# Patient Record
Sex: Female | Born: 1940 | Race: White | Hispanic: No | Smoking: Former smoker
Health system: Southern US, Community
[De-identification: ages and names within clinical notes are randomized; demographics above are authoritative.]

## PROBLEM LIST (undated history)

## (undated) DIAGNOSIS — I10 Essential (primary) hypertension: Secondary | ICD-10-CM

## (undated) DIAGNOSIS — E785 Hyperlipidemia, unspecified: Secondary | ICD-10-CM

## (undated) DIAGNOSIS — I639 Cerebral infarction, unspecified: Secondary | ICD-10-CM

## (undated) DIAGNOSIS — T7840XA Allergy, unspecified, initial encounter: Secondary | ICD-10-CM

## (undated) DIAGNOSIS — H5440 Blindness, one eye, unspecified eye: Secondary | ICD-10-CM

## (undated) DIAGNOSIS — I739 Peripheral vascular disease, unspecified: Secondary | ICD-10-CM

## (undated) DIAGNOSIS — K219 Gastro-esophageal reflux disease without esophagitis: Secondary | ICD-10-CM

## (undated) DIAGNOSIS — M1990 Unspecified osteoarthritis, unspecified site: Secondary | ICD-10-CM

## (undated) HISTORY — DX: Cerebral infarction, unspecified: I63.9

## (undated) HISTORY — DX: Unspecified osteoarthritis, unspecified site: M19.90

## (undated) HISTORY — DX: Allergy, unspecified, initial encounter: T78.40XA

## (undated) HISTORY — DX: Hyperlipidemia, unspecified: E78.5

## (undated) HISTORY — DX: Essential (primary) hypertension: I10

## (undated) HISTORY — PX: OTHER SURGICAL HISTORY: SHX169

## (undated) HISTORY — DX: Peripheral vascular disease, unspecified: I73.9

## (undated) HISTORY — PX: CAROTID ENDARTERECTOMY: SUR193

## (undated) HISTORY — DX: Gastro-esophageal reflux disease without esophagitis: K21.9

## (undated) HISTORY — DX: Blindness, one eye, unspecified eye: H54.40

---

## 1998-02-01 ENCOUNTER — Emergency Department (HOSPITAL_COMMUNITY): Admission: EM | Admit: 1998-02-01 | Discharge: 1998-02-01 | Payer: Self-pay | Admitting: Emergency Medicine

## 1999-11-21 ENCOUNTER — Encounter: Payer: Self-pay | Admitting: Internal Medicine

## 1999-11-21 ENCOUNTER — Ambulatory Visit (HOSPITAL_COMMUNITY): Admission: RE | Admit: 1999-11-21 | Discharge: 1999-11-21 | Payer: Self-pay | Admitting: Internal Medicine

## 1999-11-25 ENCOUNTER — Encounter: Payer: Self-pay | Admitting: Emergency Medicine

## 1999-11-25 ENCOUNTER — Inpatient Hospital Stay (HOSPITAL_COMMUNITY): Admission: EM | Admit: 1999-11-25 | Discharge: 1999-11-26 | Payer: Self-pay | Admitting: Emergency Medicine

## 2000-02-25 ENCOUNTER — Ambulatory Visit (HOSPITAL_COMMUNITY): Admission: RE | Admit: 2000-02-25 | Discharge: 2000-02-25 | Payer: Self-pay | Admitting: Internal Medicine

## 2000-02-25 ENCOUNTER — Encounter: Payer: Self-pay | Admitting: Internal Medicine

## 2001-05-16 ENCOUNTER — Encounter: Admission: RE | Admit: 2001-05-16 | Discharge: 2001-06-09 | Payer: Self-pay | Admitting: Orthopedic Surgery

## 2001-07-22 ENCOUNTER — Ambulatory Visit (HOSPITAL_COMMUNITY): Admission: RE | Admit: 2001-07-22 | Discharge: 2001-07-22 | Payer: Self-pay | Admitting: *Deleted

## 2001-07-22 ENCOUNTER — Encounter: Payer: Self-pay | Admitting: *Deleted

## 2001-08-03 ENCOUNTER — Encounter: Payer: Self-pay | Admitting: Vascular Surgery

## 2001-08-05 ENCOUNTER — Ambulatory Visit (HOSPITAL_COMMUNITY): Admission: RE | Admit: 2001-08-05 | Discharge: 2001-08-06 | Payer: Self-pay | Admitting: Vascular Surgery

## 2001-08-05 ENCOUNTER — Encounter: Payer: Self-pay | Admitting: Vascular Surgery

## 2001-08-16 ENCOUNTER — Encounter (INDEPENDENT_AMBULATORY_CARE_PROVIDER_SITE_OTHER): Payer: Self-pay | Admitting: *Deleted

## 2001-08-16 ENCOUNTER — Inpatient Hospital Stay (HOSPITAL_COMMUNITY): Admission: RE | Admit: 2001-08-16 | Discharge: 2001-08-18 | Payer: Self-pay | Admitting: *Deleted

## 2003-03-26 ENCOUNTER — Emergency Department (HOSPITAL_COMMUNITY): Admission: EM | Admit: 2003-03-26 | Discharge: 2003-03-27 | Payer: Self-pay | Admitting: Emergency Medicine

## 2003-12-18 ENCOUNTER — Ambulatory Visit: Payer: Self-pay | Admitting: Internal Medicine

## 2004-03-19 ENCOUNTER — Ambulatory Visit: Payer: Self-pay | Admitting: Internal Medicine

## 2004-03-26 ENCOUNTER — Ambulatory Visit: Payer: Self-pay | Admitting: Internal Medicine

## 2004-06-23 ENCOUNTER — Ambulatory Visit: Payer: Self-pay | Admitting: Internal Medicine

## 2004-08-25 ENCOUNTER — Ambulatory Visit: Payer: Self-pay | Admitting: Internal Medicine

## 2004-09-23 ENCOUNTER — Ambulatory Visit: Payer: Self-pay | Admitting: Internal Medicine

## 2005-02-10 ENCOUNTER — Ambulatory Visit: Payer: Self-pay | Admitting: Internal Medicine

## 2005-05-11 ENCOUNTER — Ambulatory Visit: Payer: Self-pay | Admitting: Internal Medicine

## 2005-08-19 ENCOUNTER — Ambulatory Visit: Payer: Self-pay | Admitting: Internal Medicine

## 2005-09-10 ENCOUNTER — Ambulatory Visit: Payer: Self-pay | Admitting: Internal Medicine

## 2006-01-08 ENCOUNTER — Ambulatory Visit: Payer: Self-pay | Admitting: Internal Medicine

## 2006-04-16 ENCOUNTER — Ambulatory Visit: Payer: Self-pay | Admitting: Internal Medicine

## 2006-06-09 ENCOUNTER — Encounter: Payer: Self-pay | Admitting: Internal Medicine

## 2006-06-09 DIAGNOSIS — E785 Hyperlipidemia, unspecified: Secondary | ICD-10-CM

## 2006-06-17 ENCOUNTER — Ambulatory Visit: Payer: Self-pay | Admitting: Internal Medicine

## 2006-06-17 LAB — CONVERTED CEMR LAB
ALT: 15 units/L (ref 0–40)
Alkaline Phosphatase: 116 units/L (ref 39–117)
BUN: 17 mg/dL (ref 6–23)
Basophils Relative: 0.3 % (ref 0.0–1.0)
Bilirubin, Direct: 0.2 mg/dL (ref 0.0–0.3)
CO2: 30 meq/L (ref 19–32)
Calcium: 10.2 mg/dL (ref 8.4–10.5)
Creatinine, Ser: 1.5 mg/dL — ABNORMAL HIGH (ref 0.4–1.2)
Eosinophils Relative: 0.1 % (ref 0.0–5.0)
Glucose, Bld: 115 mg/dL — ABNORMAL HIGH (ref 70–99)
HCT: 41.2 % (ref 36.0–46.0)
HDL: 28.2 mg/dL — ABNORMAL LOW (ref >39.0)
Hemoglobin: 14.4 g/dL (ref 12.0–15.0)
LDL Cholesterol: 74 mg/dL (ref 0–99)
Lymphocytes Relative: 20.7 % (ref 12.0–46.0)
Monocytes Absolute: 0.5 10*3/uL (ref 0.2–0.7)
Monocytes Relative: 7.8 % (ref 3.0–11.0)
Neutro Abs: 4.3 10*3/uL (ref 1.4–7.7)
Potassium: 3.8 meq/L (ref 3.5–5.1)
RDW: 12.5 % (ref 11.5–14.6)
Total Bilirubin: 0.9 mg/dL (ref 0.3–1.2)
Total Protein: 7 g/dL (ref 6.0–8.3)
VLDL: 24 mg/dL (ref 0–40)
WBC: 6 10*3/uL (ref 4.5–10.5)

## 2006-08-10 DIAGNOSIS — Z8679 Personal history of other diseases of the circulatory system: Secondary | ICD-10-CM | POA: Insufficient documentation

## 2006-08-30 ENCOUNTER — Telehealth (INDEPENDENT_AMBULATORY_CARE_PROVIDER_SITE_OTHER): Payer: Self-pay | Admitting: *Deleted

## 2006-09-27 ENCOUNTER — Ambulatory Visit: Payer: Self-pay | Admitting: Internal Medicine

## 2006-09-27 DIAGNOSIS — I6990 Unspecified sequelae of unspecified cerebrovascular disease: Secondary | ICD-10-CM | POA: Insufficient documentation

## 2006-09-27 DIAGNOSIS — M1990 Unspecified osteoarthritis, unspecified site: Secondary | ICD-10-CM | POA: Insufficient documentation

## 2006-09-27 DIAGNOSIS — I739 Peripheral vascular disease, unspecified: Secondary | ICD-10-CM

## 2006-09-30 ENCOUNTER — Telehealth: Payer: Self-pay | Admitting: Internal Medicine

## 2006-10-04 ENCOUNTER — Telehealth: Payer: Self-pay | Admitting: Internal Medicine

## 2006-10-05 ENCOUNTER — Ambulatory Visit: Payer: Self-pay | Admitting: Internal Medicine

## 2006-10-05 DIAGNOSIS — I10 Essential (primary) hypertension: Secondary | ICD-10-CM

## 2006-10-07 ENCOUNTER — Ambulatory Visit: Payer: Self-pay | Admitting: Cardiology

## 2006-10-12 ENCOUNTER — Ambulatory Visit: Payer: Self-pay | Admitting: Internal Medicine

## 2006-10-12 DIAGNOSIS — F411 Generalized anxiety disorder: Secondary | ICD-10-CM | POA: Insufficient documentation

## 2006-10-12 LAB — CONVERTED CEMR LAB
CO2: 27 meq/L (ref 19–32)
GFR calc Af Amer: 49 mL/min
GFR calc non Af Amer: 40 mL/min
Glucose, Bld: 120 mg/dL — ABNORMAL HIGH (ref 70–99)
Potassium: 4.4 meq/L (ref 3.5–5.1)

## 2006-11-11 ENCOUNTER — Ambulatory Visit: Payer: Self-pay | Admitting: Internal Medicine

## 2006-12-10 ENCOUNTER — Ambulatory Visit: Payer: Self-pay | Admitting: Internal Medicine

## 2006-12-10 DIAGNOSIS — J309 Allergic rhinitis, unspecified: Secondary | ICD-10-CM | POA: Insufficient documentation

## 2006-12-28 ENCOUNTER — Telehealth (INDEPENDENT_AMBULATORY_CARE_PROVIDER_SITE_OTHER): Payer: Self-pay | Admitting: *Deleted

## 2007-03-02 ENCOUNTER — Telehealth: Payer: Self-pay | Admitting: *Deleted

## 2007-03-09 ENCOUNTER — Ambulatory Visit: Payer: Self-pay | Admitting: Internal Medicine

## 2007-03-09 DIAGNOSIS — K219 Gastro-esophageal reflux disease without esophagitis: Secondary | ICD-10-CM

## 2007-06-07 ENCOUNTER — Ambulatory Visit: Payer: Self-pay | Admitting: Internal Medicine

## 2007-08-30 ENCOUNTER — Telehealth: Payer: Self-pay | Admitting: *Deleted

## 2007-08-30 ENCOUNTER — Telehealth: Payer: Self-pay | Admitting: Internal Medicine

## 2007-09-06 ENCOUNTER — Ambulatory Visit: Payer: Self-pay | Admitting: Internal Medicine

## 2007-10-10 ENCOUNTER — Telehealth: Payer: Self-pay | Admitting: Internal Medicine

## 2007-10-13 ENCOUNTER — Encounter: Payer: Self-pay | Admitting: Internal Medicine

## 2007-11-30 ENCOUNTER — Ambulatory Visit: Payer: Self-pay | Admitting: Internal Medicine

## 2008-03-23 ENCOUNTER — Ambulatory Visit: Payer: Self-pay | Admitting: Internal Medicine

## 2008-03-23 DIAGNOSIS — M5412 Radiculopathy, cervical region: Secondary | ICD-10-CM | POA: Insufficient documentation

## 2008-04-17 ENCOUNTER — Encounter: Payer: Self-pay | Admitting: Internal Medicine

## 2008-05-22 ENCOUNTER — Ambulatory Visit: Payer: Self-pay | Admitting: Internal Medicine

## 2008-05-22 DIAGNOSIS — R634 Abnormal weight loss: Secondary | ICD-10-CM

## 2008-05-22 LAB — CONVERTED CEMR LAB
ALT: 20 units/L (ref 0–35)
AST: 21 units/L (ref 0–37)
Alkaline Phosphatase: 130 units/L — ABNORMAL HIGH (ref 39–117)
Basophils Absolute: 0 10*3/uL (ref 0.0–0.1)
Bilirubin, Direct: 0.1 mg/dL (ref 0.0–0.3)
Eosinophils Absolute: 0 10*3/uL (ref 0.0–0.7)
Eosinophils Relative: 0.1 % (ref 0.0–5.0)
HCT: 38.8 % (ref 36.0–46.0)
Lymphs Abs: 1.2 10*3/uL (ref 0.7–4.0)
MCHC: 35.6 g/dL (ref 30.0–36.0)
MCV: 96 fL (ref 78.0–100.0)
Monocytes Absolute: 0.5 10*3/uL (ref 0.1–1.0)
Platelets: 169 10*3/uL (ref 150.0–400.0)
RDW: 12.7 % (ref 11.5–14.6)
T3, Free: 2.8 pg/mL (ref 2.3–4.2)
Total Bilirubin: 0.8 mg/dL (ref 0.3–1.2)

## 2008-07-06 ENCOUNTER — Encounter: Payer: Self-pay | Admitting: Internal Medicine

## 2008-07-12 ENCOUNTER — Telehealth (INDEPENDENT_AMBULATORY_CARE_PROVIDER_SITE_OTHER): Payer: Self-pay | Admitting: *Deleted

## 2008-07-16 ENCOUNTER — Encounter: Payer: Self-pay | Admitting: Internal Medicine

## 2008-07-16 ENCOUNTER — Telehealth: Payer: Self-pay | Admitting: Internal Medicine

## 2008-07-23 ENCOUNTER — Ambulatory Visit: Payer: Self-pay | Admitting: Internal Medicine

## 2008-07-23 LAB — CONVERTED CEMR LAB: HDL: 32.8 mg/dL — ABNORMAL LOW (ref >39.00)

## 2008-09-17 ENCOUNTER — Telehealth (INDEPENDENT_AMBULATORY_CARE_PROVIDER_SITE_OTHER): Payer: Self-pay | Admitting: *Deleted

## 2008-09-22 ENCOUNTER — Telehealth: Payer: Self-pay | Admitting: Family Medicine

## 2008-09-22 ENCOUNTER — Emergency Department (HOSPITAL_COMMUNITY): Admission: EM | Admit: 2008-09-22 | Discharge: 2008-09-23 | Payer: Self-pay | Admitting: Emergency Medicine

## 2008-09-24 ENCOUNTER — Telehealth: Payer: Self-pay | Admitting: Internal Medicine

## 2008-09-25 ENCOUNTER — Ambulatory Visit: Payer: Self-pay | Admitting: Internal Medicine

## 2008-10-02 ENCOUNTER — Ambulatory Visit: Payer: Self-pay | Admitting: Internal Medicine

## 2008-10-02 DIAGNOSIS — S0100XA Unspecified open wound of scalp, initial encounter: Secondary | ICD-10-CM | POA: Insufficient documentation

## 2008-10-25 ENCOUNTER — Ambulatory Visit: Payer: Self-pay | Admitting: Internal Medicine

## 2009-01-16 ENCOUNTER — Ambulatory Visit: Payer: Self-pay | Admitting: Internal Medicine

## 2009-04-23 ENCOUNTER — Ambulatory Visit: Payer: Self-pay | Admitting: Internal Medicine

## 2009-04-23 DIAGNOSIS — T887XXA Unspecified adverse effect of drug or medicament, initial encounter: Secondary | ICD-10-CM

## 2009-04-23 LAB — CONVERTED CEMR LAB
ALT: 14 units/L (ref 0–35)
BUN: 16 mg/dL (ref 6–23)
Basophils Relative: 0.3 % (ref 0.0–3.0)
CO2: 31 meq/L (ref 19–32)
Chloride: 106 meq/L (ref 96–112)
Creatinine, Ser: 1.3 mg/dL — ABNORMAL HIGH (ref 0.4–1.2)
Direct LDL: 136.9 mg/dL
Eosinophils Absolute: 0 10*3/uL (ref 0.0–0.7)
MCHC: 34.2 g/dL (ref 30.0–36.0)
MCV: 96.9 fL (ref 78.0–100.0)
Monocytes Absolute: 0.3 10*3/uL (ref 0.1–1.0)
Neutrophils Relative %: 76.2 % (ref 43.0–77.0)
RBC: 4.24 M/uL (ref 3.87–5.11)
RDW: 11.5 % (ref 11.5–14.6)
TSH: 2.11 microintl units/mL (ref 0.35–5.50)
Total Protein: 7.3 g/dL (ref 6.0–8.3)

## 2009-08-30 ENCOUNTER — Ambulatory Visit: Payer: Self-pay | Admitting: Internal Medicine

## 2009-08-30 DIAGNOSIS — M1710 Unilateral primary osteoarthritis, unspecified knee: Secondary | ICD-10-CM | POA: Insufficient documentation

## 2009-11-25 ENCOUNTER — Telehealth: Payer: Self-pay | Admitting: Internal Medicine

## 2009-11-26 ENCOUNTER — Ambulatory Visit: Payer: Self-pay | Admitting: Family Medicine

## 2009-11-26 DIAGNOSIS — L600 Ingrowing nail: Secondary | ICD-10-CM

## 2009-12-27 ENCOUNTER — Ambulatory Visit: Payer: Self-pay | Admitting: Internal Medicine

## 2009-12-27 DIAGNOSIS — B3749 Other urogenital candidiasis: Secondary | ICD-10-CM | POA: Insufficient documentation

## 2009-12-27 DIAGNOSIS — L732 Hidradenitis suppurativa: Secondary | ICD-10-CM | POA: Insufficient documentation

## 2009-12-27 LAB — CONVERTED CEMR LAB
Glucose, Urine, Semiquant: NEGATIVE
Specific Gravity, Urine: <1.005
WBC Urine, dipstick: NEGATIVE
pH: 5

## 2010-01-20 ENCOUNTER — Telehealth: Payer: Self-pay | Admitting: Internal Medicine

## 2010-03-11 NOTE — Assessment & Plan Note (Signed)
Summary: 4 month fup//ccm   Vital Signs:  Patient profile:   70 year old female Height:      66 inches Weight:      147 pounds BMI:     23.81 Temp:     98.2 degrees F oral Pulse rate:   72 / minute Resp:     14 per minute BP sitting:   138 / 80  (left arm)  Vitals Entered By: Willy Eddy, LPN (December 27, 2009 1:29 PM) CC: roa- c/o vaginal itching since taking antibioitc- also check brown spots under rt arm, Hypertension Management Is Patient Diabetic? No   Primary Care Provider:  Stacie Glaze MD  CC:  roa- c/o vaginal itching since taking antibioitc- also check brown spots under rt arm and Hypertension Management.  History of Present Illness: follow up for post CVA complicatins of gate, HTN, hyperlipidemia diet and weigth management and  chronic GERD She has an acute complaint of vaginosis and is not sexually active but has recently been on ABX  Hypertension History:      She denies headache, chest pain, palpitations, dyspnea with exertion, orthopnea, PND, peripheral edema, visual symptoms, neurologic problems, syncope, and side effects from treatment.        Positive major cardiovascular risk factors include female age 41 years old or older, hyperlipidemia, and hypertension.  Negative major cardiovascular risk factors include non-tobacco-user status.        Positive history for target organ damage include prior stroke (or TIA) and peripheral vascular disease.     Preventive Screening-Counseling & Management  Alcohol-Tobacco     Smoking Status: quit     Packs/Day: 0.5     Year Started: 1960     Year Quit: 1996     Tobacco Counseling: to remain off tobacco products  Problems Prior to Update: 1)  Hidradenitis  (ICD-705.83) 2)  Candidiasis of Other Urogenital Sites  (ICD-112.2) 3)  Ingrowing Nail  (ICD-703.0) 4)  Loc Osteoarthros Not Spec Prim/sec Lower Leg  (ICD-715.36) 5)  Uns Advrs Eff Uns Rx Medicinal&biological Sbstnc  (ICD-995.20) 6)  Laceration, Scalp   (ICD-873.0) 7)  Weight Loss  (ICD-783.21) 8)  Cervical Strain, With Radiculopathy  (ICD-723.4) 9)  Ear Pain, Bilateral  (ICD-388.70) 10)  Gerd  (ICD-530.81) 11)  Allergic Rhinitis  (ICD-477.9) 12)  Anxiety Disorder, Acute  (ICD-300.00) 13)  Hypertension, Malignant Essential  (ICD-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) 14)  Peripheral Vascular Disease  (ICD-443.9) 15)  Osteoarthritis  (ICD-715.90) 16)  Cerebrovascular Disease Late Effects, 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) 17)  Cerebrovascular Accident, Hx of  (ICD-V12.50) 18)  Hyperlipidemia  (ICD-272.4)  Current Problems (verified): 1)  Ingrowing Nail  (ICD-703.0) 2)  Loc Osteoarthros Not Spec Prim/sec Lower Leg  (ICD-715.36) 3)  Uns Advrs Eff Uns Rx Medicinal&biological Sbstnc  (ICD-995.20) 4)  Laceration, Scalp  (ICD-873.0) 5)  Weight Loss  (ICD-783.21) 6)  Cervical Strain, With Radiculopathy  (ICD-723.4) 7)  Ear Pain, Bilateral  (ICD-388.70) 8)  Gerd  (ICD-530.81) 9)  Allergic Rhinitis  (ICD-477.9) 10)  Anxiety Disorder, Acute  (ICD-300.00) 11)  Hypertension, Malignant Essential  (ICD-401.0) 12)  Peripheral Vascular Disease  (ICD-443.9) 13)  Osteoarthritis  (ICD-715.90) 14)  Cerebrovascular Disease Late Effects, 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 Once Daily 2)  Lipitor 10 Mg  Tabs (Atorvastatin Calcium) .... 1/2 Tab By Mouth Daily 3)  Nexium 40 Mg  Cpdr (Esomeprazole Magnesium) .... Once Daily 4)  Plavix 75 Mg  Tabs (Clopidogrel Bisulfate) .... Once Daily 5)  Catapres-Tts-2 0.2 Mg/24hr Ptwk (Clonidine Hcl) .... Apply Weekly 6)  Alprazolam 0.25 Mg  Tabs (Alprazolam) .... One By Mouth Bid 7)  Carvedilol 25 Mg Tabs (Carvedilol) .... 1/2 By Mouth Two Times A Day 8)  Cyclobenzaprine Hcl 10 Mg Tabs (Cyclobenzaprine Hcl) .... One By Mouth Q Hs 9)  Ambien 10 Mg Tabs (Zolpidem Tartrate) .Marland Kitchen.. 1 At Bedtime As Needed 10)  Azor 10-40 Mg  Tabs (Amlodipine-Olmesartan) .... One By Mouth Daily 11)  Cephalexin 500 Mg Caps (Cephalexin) .... One By Mouth Three Times A Day For 10 Days  Current Medications (verified): 1)  Lexapro 10 Mg  Tabs (Escitalopram Oxalate) .... Once Daily 2)  Crestor 20 Mg Tabs (Rosuvastatin Calcium) .... One By Mouth Weekly 3)  Nexium 40 Mg  Cpdr (Esomeprazole Magnesium) .... Once Daily 4)  Plavix 75 Mg  Tabs (Clopidogrel Bisulfate) .... Once Daily 5)  Catapres-Tts-2 0.2 Mg/24hr Ptwk (Clonidine Hcl) .... Apply Weekly 6)  Alprazolam 0.25 Mg  Tabs (Alprazolam) .... One By Mouth Bid 7)  Carvedilol 25 Mg Tabs (Carvedilol) .... 1/2 By Mouth Two Times A Day 8)  Cyclobenzaprine Hcl 10 Mg Tabs (Cyclobenzaprine Hcl) .... One By Mouth Q Hs 9)  Ambien 10 Mg Tabs (Zolpidem Tartrate) .Marland Kitchen.. 1 At Bedtime As Needed 10)  Azor 10-40 Mg Tabs (Amlodipine-Olmesartan) .... One By Mouth Daily 11)  Mupirocin 2 % Oint (Mupirocin) .... Apply To Redness Under The Arm Two Times A Day 12)  Fluconazole 100 Mg Tabs (Fluconazole) .... One By Mouth Daily For One Week Fro Yeast Infection  Allergies (verified): 1)  Codeine Sulfate (Codeine Sulfate)  Past History:  Family History: Last updated: 2006-10-14 father died from cancer mother  alzheimer's  Social History: Last updated: 10-14-2006 Retired Former Smoker widowed  Risk Factors: Smoking Status: quit (12/27/2009) Packs/Day: 0.5 (12/27/2009)  Past medical, surgical, family and social histories (including risk factors) reviewed, and no changes noted (except as noted below).  Past Medical History: Reviewed history from 03/09/2007 and no changes required. Hyperlipidemia Cerebrovascular accident, hx of-Right hemiparesis Hypertension Osteoarthritis Peripheral vascular disease blind in left eye Allergic rhinitis GERD  Past Surgical History: Reviewed history from 10-14-2006 and no changes required. Carotid endarterectomy2003  right foot surgery right metatarsal  amputation right knee arthroscopy  Family History: Reviewed history from 10-14-06 and no changes required. father died from cancer mother  alzheimer's  Social History: Reviewed history from Oct 14, 2006 and no changes required. Retired Former Smoker widowed  Review of Systems  The patient denies anorexia, fever, weight loss, weight gain, vision loss, decreased hearing, hoarseness, chest pain, syncope, dyspnea on exertion, peripheral edema, prolonged cough, headaches, hemoptysis, abdominal pain, melena, hematochezia, severe indigestion/heartburn, hematuria, incontinence, genital sores, muscle weakness, suspicious skin lesions, transient blindness, difficulty walking, depression, unusual weight change, abnormal bleeding, enlarged lymph nodes, angioedema, and breast masses.    Physical Exam  General:  Well-developed,well-nourished,in no acute distress; alert,appropriate and cooperative throughout examination Head:  normocephalic and no abnormalities observed.   Eyes:  pupils equal and pupils reactive to light.   Ears:  R ear normal and L ear normal.   Nose:  no nasal discharge.   Mouth:  good dentition and pharynx pink and moist.   Neck:  No deformities, masses, or tenderness noted. Lungs:  Normal respiratory effort, chest expands symmetrically. Lungs are clear to auscultation, no crackles or wheezes. Heart:  normal rate and regular rhythm.  Abdomen:  soft and non-tender.   Msk:  no CVA tenderness. Pulses:  R and L carotid,radial,femoral,dorsalis pedis and posterior tibial pulses are full and equal bilaterally Neurologic:  alert & oriented X3 and gait normal.     Impression & Recommendations:  Problem # 1:  HIDRADENITIS (ICD-705.83) Assessment New bactroban to site  Problem # 2:  CANDIDIASIS OF OTHER UROGENITAL SITES (ICD-112.2) Assessment: New after the antibiotics she has a vaginal yeast infections  Problem # 3:  HYPERTENSION, MALIGNANT ESSENTIAL (ICD-401.0)  Her  updated medication list for this problem includes:    Catapres-tts-2 0.2 Mg/24hr Ptwk (Clonidine hcl) .Marland Kitchen... Apply weekly    Carvedilol 25 Mg Tabs (Carvedilol) .Marland Kitchen... 1/2 by mouth two times a day    Azor 10-40 Mg Tabs (Amlodipine-olmesartan) ..... One by mouth daily  BP today: 170/80 Prior BP: 166/80 (11/26/2009)  Prior 10 Yr Risk Heart Disease: 13 % (08/30/2009)  Labs Reviewed: K+: 3.8 (04/23/2009) Creat: : 1.3 (04/23/2009)   Chol: 204 (04/23/2009)   HDL: 38.70 (04/23/2009)   LDL: 74 (06/17/2006)   TG: 121 (06/17/2006)  Problem # 4:  ANXIETY DISORDER, ACUTE (ICD-300.00) very nervous Her updated medication list for this problem includes:    Lexapro 10 Mg Tabs (Escitalopram oxalate) ..... Once daily    Alprazolam 0.25 Mg Tabs (Alprazolam) ..... One by mouth bid  Complete Medication List: 1)  Lexapro 10 Mg Tabs (Escitalopram oxalate) .... Once daily 2)  Crestor 20 Mg Tabs (Rosuvastatin calcium) .... One by mouth weekly 3)  Nexium 40 Mg Cpdr (Esomeprazole magnesium) .... Once daily 4)  Plavix 75 Mg Tabs (Clopidogrel bisulfate) .... Once daily 5)  Catapres-tts-2 0.2 Mg/24hr Ptwk (Clonidine hcl) .... Apply weekly 6)  Alprazolam 0.25 Mg Tabs (Alprazolam) .... One by mouth bid 7)  Carvedilol 25 Mg Tabs (Carvedilol) .... 1/2 by mouth two times a day 8)  Cyclobenzaprine Hcl 10 Mg Tabs (Cyclobenzaprine hcl) .... One by mouth q hs 9)  Ambien 10 Mg Tabs (Zolpidem tartrate) .Marland Kitchen.. 1 at bedtime as needed 10)  Azor 10-40 Mg Tabs (Amlodipine-olmesartan) .... One by mouth daily 11)  Mupirocin 2 % Oint (Mupirocin) .... Apply to redness under the arm two times a day 12)  Fluconazole 100 Mg Tabs (Fluconazole) .... One by mouth daily for one week fro yeast infection  Other Orders: UA Dipstick w/o Micro (manual) (19147)  Hypertension Assessment/Plan:      The patient's hypertensive risk group is category C: Target organ damage and/or diabetes.  Her calculated 10 year risk of coronary heart disease is 9  %.  Today's blood pressure is 138/80.  Her blood pressure goal is < 140/90.  Patient Instructions: 1)  Please schedule a follow-up appointment in 4 months. Prescriptions: FLUCONAZOLE 100 MG TABS (FLUCONAZOLE) one by mouth daily for one week fro yeast infection  #7 x 1   Entered and Authorized by:   Stacie Glaze MD   Signed by:   Stacie Glaze MD on 12/27/2009   Method used:   Electronically to        [facility]  [address] [phone]* (retail)       [address]       [place], [place]  [address]       Ph: [phone] or [phone]       Fax: [phone]   RxID:   [id number] MUPIROCIN 2 % OINT (MUPIROCIN) apply to redness under the arm two times a day  #  15 gm x 11   Entered and Authorized by:   Stacie Glaze MD   Signed by:   Stacie Glaze MD on 12/27/2009   Method used:   Electronically to        [facility]  [address] [phone]* (retail)       [address]       [place], [place]  [address]       Ph: [phone] or [phone]       Fax: [phone]   RxID:   [id number]    Orders Added: 1)  UA Dipstick w/o Micro (manual) [81002] 2)  Est. Patient Level IV [56387]     Laboratory Results   Urine Tests  Date/Time Received: December 27, 2009 2:36 PM  Routine Urinalysis   Color: yellow Appearance: Clear Glucose: negative   (Normal Range: Negative) Bilirubin: negative   (Normal Range: Negative) Ketone: negative   (Normal Range: Negative) Spec. Gravity: <1.005   (Normal Range: 1.003-1.035) Blood: trace-lysed   (Normal Range: Negative) pH: 5.0   (Normal Range: 5.0-8.0) Protein: negative   (Normal Range: Negative) Urobilinogen: 0.2   (Normal Range: 0-1) Nitrite: negative   (Normal Range: Negative) Leukocyte Esterace: negative   (Normal Range: Negative)    Comments: Alfred Levins, CMA  December 27, 2009 2:35 PM

## 2010-03-11 NOTE — Assessment & Plan Note (Signed)
Summary: 3 MTH ROV // RS/PT RESCD//CCM   Vital Signs:  Patient profile:   70 year old female Height:      66 inches Weight:      144 pounds BMI:     23.33 Temp:     98.2 degrees F oral Pulse rate:   76 / minute Resp:     14 per minute BP sitting:   140 / 80  (left arm)  Vitals Entered By: Willy Eddy, LPN (April 23, 2009 1:30 PM) CC: roa, Hypertension Management   CC:  roa and Hypertension Management.  History of Present Illness: blood pressure stable and has been taking the medications as directed has a viral syndrome that has resolved vitrals and weight are stable  Hypertension History:      She complains of peripheral edema, but denies headache, chest pain, palpitations, dyspnea with exertion, orthopnea, PND, visual symptoms, neurologic problems, syncope, and side effects from treatment.  stable.        Positive major cardiovascular risk factors include female age 42 years old or older, hyperlipidemia, and hypertension.  Negative major cardiovascular risk factors include non-tobacco-user status.        Positive history for target organ damage include prior stroke (or TIA) and peripheral vascular disease.     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) 9)  Peripheral Vascular Disease  (ICD-443.9) 10)  Osteoarthritis  (ICD-715.90) 11)  Cerebrovascular Disease Late Effects, 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) 12)  Cerebrovascular Accident, Hx of  (ICD-V12.50) 13)  Hyperlipidemia  (ICD-272.4)  Current Problems (verified): 1)  Laceration, Scalp   (ICD-873.0) 2)  Weight Loss  (ICD-783.21) 3)  Cervical Strain, With Radiculopathy  (ICD-723.4) 4)  Ear Pain, Bilateral  (ICD-388.70) 5)  Gerd  (ICD-530.81) 6)  Allergic Rhinitis  (ICD-477.9) 7)  Anxiety Disorder, Acute  (ICD-300.00) 8)  Hypertension, Malignant Essential  (ICD-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 Once Daily 2)  Lipitor 10 Mg  Tabs (Atorvastatin Calcium) .... 1/2 Tab By Mouth Daily 3)  Nexium 40 Mg  Cpdr (Esomeprazole Magnesium) .... Once Daily 4)  Plavix 75 Mg  Tabs (Clopidogrel Bisulfate) .... Once Daily 5)  Catapres-Tts-2 0.2 Mg/24hr Ptwk (Clonidine Hcl) .... Apply Weekly 6)  Alprazolam 0.25 Mg  Tabs (Alprazolam) .... One By Mouth Bid 7)  Carvedilol 25 Mg Tabs (Carvedilol) .... 1/2 By Mouth Two Times A Day 8)  Cyclobenzaprine Hcl 10 Mg Tabs (Cyclobenzaprine Hcl) .... One By Mouth Q Hs 9)  Ambien 10 Mg Tabs (Zolpidem Tartrate) .Marland Kitchen.. 1 At Bedtime As Needed 10)  Azor 10-40 Mg Tabs (Amlodipine-Olmesartan) .... One By Mouth Daily  Current Medications (verified): 1)  Lexapro 10 Mg  Tabs (Escitalopram Oxalate) .... Once Daily 2)  Lipitor 10 Mg  Tabs (Atorvastatin Calcium) .Marland KitchenMarland KitchenMarland Kitchen  1/2 Tab By Mouth Daily 3)  Nexium 40 Mg  Cpdr (Esomeprazole Magnesium) .... Once Daily 4)  Plavix 75 Mg  Tabs (Clopidogrel Bisulfate) .... Once Daily 5)  Catapres-Tts-2 0.2 Mg/24hr Ptwk (Clonidine Hcl) .... Apply Weekly 6)  Alprazolam 0.25 Mg  Tabs (Alprazolam) .... One By Mouth Bid 7)  Carvedilol 25 Mg Tabs (Carvedilol) .... 1/2 By Mouth Two Times A Day 8)  Cyclobenzaprine Hcl 10 Mg Tabs (Cyclobenzaprine Hcl) .... One By Mouth Q Hs 9)  Ambien 10 Mg Tabs (Zolpidem Tartrate) .Marland Kitchen.. 1 At Bedtime As Needed 10)  Azor 10-40 Mg Tabs  (Amlodipine-Olmesartan) .... One By Mouth Daily  Allergies (verified): No Known Drug Allergies  Past History:  Family History: Last updated: October 13, 2006 father died from cancer mother  alzheimer's  Social History: Last updated: 10-13-06 Retired Former Smoker widowed  Risk Factors: Smoking Status: quit (04/23/2009) Packs/Day: 0.5 (04/23/2009)  Past medical, surgical, family and social histories (including risk factors) reviewed, and no changes noted (except as noted below).  Past Medical History: Reviewed history from 03/09/2007 and no changes required. Hyperlipidemia Cerebrovascular accident, hx of-Right hemiparesis Hypertension Osteoarthritis Peripheral vascular disease blind in left eye Allergic rhinitis GERD  Past Surgical History: Reviewed history from October 13, 2006 and no changes required. Carotid endarterectomy2003  right foot surgery right metatarsal amputation right knee arthroscopy  Family History: Reviewed history from Oct 13, 2006 and no changes required. father died from cancer mother  alzheimer's  Social History: Reviewed history from 10/13/2006 and no changes required. Retired Former Smoker widowed  Review of Systems  The patient denies anorexia, fever, weight loss, weight gain, vision loss, decreased hearing, hoarseness, chest pain, syncope, dyspnea on exertion, peripheral edema, prolonged cough, headaches, hemoptysis, abdominal pain, melena, hematochezia, severe indigestion/heartburn, hematuria, incontinence, genital sores, muscle weakness, suspicious skin lesions, transient blindness, difficulty walking, depression, unusual weight change, abnormal bleeding, enlarged lymph nodes, angioedema, and breast masses.    Physical Exam  General:  well-nourished, well-hydrated, and pale.   Head:  normocephalic and no abnormalities observed.   Eyes:  pupils equal and pupils reactive to light.   Ears:  R ear normal and L ear normal.   Nose:  no nasal  discharge.   Neck:  No deformities, masses, or tenderness noted. Lungs:  normal respiratory effort and no wheezes.   Heart:  normal rate and regular rhythm.   Abdomen:  soft and non-tender.   Msk:  No deformity or scoliosis noted of thoracic or lumbar spine.   Pulses:  R and L carotid,radial,femoral,dorsalis pedis and posterior tibial pulses are full and equal bilaterally Extremities:  No clubbing, cyanosis, edema, or deformity noted with normal full range of motion of all joints.   Neurologic:  No cranial nerve deficits noted. Station and gait are normal. Plantar reflexes are down-going bilaterally. DTRs are symmetrical throughout. Sensory, motor and coordinative functions appear intact. Skin:  Intact without suspicious lesions or rashes Cervical Nodes:  No lymphadenopathy noted   Impression & Recommendations:  Problem # 1:  HYPERTENSION, MALIGNANT ESSENTIAL (ICD-401.0)  monitering Her updated medication list for this problem includes:    Catapres-tts-2 0.2 Mg/24hr Ptwk (Clonidine hcl) .Marland Kitchen... Apply weekly    Carvedilol 25 Mg Tabs (Carvedilol) .Marland Kitchen... 1/2 by mouth two times a day    Azor 10-40 Mg Tabs (Amlodipine-olmesartan) ..... One by mouth daily  BP today: 140/80 Prior BP: 124/80 (01/16/2009)  Prior 10 Yr Risk Heart Disease: 20 % (09/25/2008)  Labs Reviewed: K+: 4.4 (10/12/2006) Creat: : 1.4 (10/12/2006)   Chol: 171 (07/23/2008)  HDL: 32.80 (07/23/2008)   LDL: 74 (06/17/2006)   TG: 121 (06/17/2006)  Orders: TLB-BMP (Basic Metabolic Panel-BMET) (80048-METABOL)  Problem # 2:  HYPERLIPIDEMIA (ICD-272.4)  Her updated medication list for this problem includes:    Lipitor 10 Mg Tabs (Atorvastatin calcium) .Marland Kitchen... 1/2 tab by mouth daily due monitering today Labs Reviewed: SGOT: 21 (05/22/2008)   SGPT: 20 (05/22/2008)  Lipid Goals: Chol Goal: 200 (12/10/2006)   HDL Goal: 40 (12/10/2006)   LDL Goal: 100 (12/10/2006)   TG Goal: 150 (12/10/2006)  Prior 10 Yr Risk Heart Disease: 20  % (09/25/2008)   HDL:32.80 (07/23/2008), 28.2 (06/17/2006)  LDL:74 (06/17/2006)  Chol:171 (07/23/2008), 126 (06/17/2006)  Trig:121 (06/17/2006)  Orders: TLB-Cholesterol, HDL (83718-HDL) TLB-Cholesterol, Direct LDL (83721-DIRLDL) TLB-Cholesterol, Total (82465-CHO) TLB-TSH (Thyroid Stimulating Hormone) (84443-TSH)  Problem # 3:  GERD (ICD-530.81)  Her updated medication list for this problem includes:    Nexium 40 Mg Cpdr (Esomeprazole magnesium) ..... Once daily  Labs Reviewed: Hgb: 13.8 (05/22/2008)   Hct: 38.8 (05/22/2008)  Orders: Venipuncture (14782) TLB-CBC Platelet - w/Differential (85025-CBCD)  Complete Medication List: 1)  Lexapro 10 Mg Tabs (Escitalopram oxalate) .... Once daily 2)  Lipitor 10 Mg Tabs (Atorvastatin calcium) .... 1/2 tab by mouth daily 3)  Nexium 40 Mg Cpdr (Esomeprazole magnesium) .... Once daily 4)  Plavix 75 Mg Tabs (Clopidogrel bisulfate) .... Once daily 5)  Catapres-tts-2 0.2 Mg/24hr Ptwk (Clonidine hcl) .... Apply weekly 6)  Alprazolam 0.25 Mg Tabs (Alprazolam) .... One by mouth bid 7)  Carvedilol 25 Mg Tabs (Carvedilol) .... 1/2 by mouth two times a day 8)  Cyclobenzaprine Hcl 10 Mg Tabs (Cyclobenzaprine hcl) .... One by mouth q hs 9)  Ambien 10 Mg Tabs (Zolpidem tartrate) .Marland Kitchen.. 1 at bedtime as needed 10)  Azor 10-40 Mg Tabs (Amlodipine-olmesartan) .... One by mouth daily  Other Orders: TLB-Hepatic/Liver Function Pnl (80076-HEPATIC)  Hypertension Assessment/Plan:      The patient's hypertensive risk group is category C: Target organ damage and/or diabetes.  Her calculated 10 year risk of coronary heart disease is 20 %.  Today's blood pressure is 140/80.  Her blood pressure goal is < 140/90.  Patient Instructions: 1)  Please schedule a follow-up appointment in 4 months.

## 2010-03-11 NOTE — Assessment & Plan Note (Signed)
Summary: 4 month rov/njr   Vital Signs:  Patient profile:   70 year old female Height:      66 inches Weight:      146 pounds BMI:     23.65 Temp:     98.2 degrees F oral Pulse rate:   72 / minute Resp:     14 per minute BP sitting:   140 / 80  (left arm)  Vitals Entered By: Willy Eddy, LPN (August 30, 2009 1:52 PM) CC: roa-c/o left knee pain, Hypertension Management, Lipid Management Is Patient Diabetic? No   CC:  roa-c/o left knee pain, Hypertension Management, and Lipid Management.  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 increased pain in her right knee with walking, no rest pain no sweeling , no acute injury  Hypertension History:      She denies headache, chest pain, palpitations, dyspnea with exertion, orthopnea, PND, peripheral edema, visual symptoms, neurologic problems, syncope, and side effects from treatment.        Positive major cardiovascular risk factors include female age 4 years old or older, hyperlipidemia, and hypertension.  Negative major cardiovascular risk factors include non-tobacco-user status.        Positive history for target organ damage include prior stroke (or TIA) and peripheral vascular disease.    Lipid Management History:      Positive NCEP/ATP III risk factors include female age 33 years old or older, HDL cholesterol less than 40, hypertension, prior stroke (or TIA), and peripheral vascular disease.  Negative NCEP/ATP III risk factors include non-tobacco-user status.      Preventive Screening-Counseling & Management  Alcohol-Tobacco     Smoking Status: quit     Packs/Day: 0.5     Year Started: 1960     Year Quit: 1996  Problems Prior to Update: 1)  Loc Osteoarthros Not Spec Prim/sec Lower Leg  (ICD-715.36) 2)  Uns Advrs Eff Uns Rx Medicinal&biological Sbstnc   (ICD-995.20) 3)  Laceration, Scalp  (ICD-873.0) 4)  Weight Loss  (ICD-783.21) 5)  Cervical Strain, With Radiculopathy  (ICD-723.4) 6)  Ear Pain, Bilateral  (ICD-388.70) 7)  Gerd  (ICD-530.81) 8)  Allergic Rhinitis  (ICD-477.9) 9)  Anxiety Disorder, Acute  (ICD-300.00) 10)  Hypertension, Malignant Essential  (ICD-401.0) 11)  Peripheral Vascular Disease  (ICD-443.9) 12)  Osteoarthritis  (ICD-715.90) 13)  Cerebrovascular Disease Late Effects, 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) 14)  Cerebrovascular Accident, Hx of  (ICD-V12.50) 15)  Hyperlipidemia  (ICD-272.4)  Current Problems (verified): 1)  Uns Advrs Eff Uns Rx Medicinal&biological Sbstnc  (ICD-995.20) 2)  Laceration, Scalp  (ICD-873.0) 3)  Weight Loss  (ICD-783.21) 4)  Cervical Strain, With Radiculopathy  (ICD-723.4) 5)  Ear Pain, Bilateral  (ICD-388.70) 6)  Gerd  (ICD-530.81) 7)  Allergic Rhinitis  (ICD-477.9) 8)  Anxiety Disorder, Acute  (ICD-300.00) 9)  Hypertension, Malignant Essential  (ICD-401.0) 10)  Peripheral Vascular Disease  (ICD-443.9) 11)  Osteoarthritis  (ICD-715.90) 12)  Cerebrovascular Disease Late Effects, 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 Once Daily 2)  Lipitor 10 Mg  Tabs (Atorvastatin Calcium) .... 1/2 Tab By Mouth Daily 3)  Nexium 40 Mg  Cpdr (Esomeprazole Magnesium) .... Once Daily 4)  Plavix 75 Mg  Tabs (  Clopidogrel Bisulfate) .... Once Daily 5)  Catapres-Tts-2 0.2 Mg/24hr Ptwk (Clonidine Hcl) .... Apply Weekly 6)  Alprazolam 0.25 Mg  Tabs (Alprazolam) .... One By Mouth Bid 7)  Carvedilol 25 Mg Tabs (Carvedilol) .... 1/2 By Mouth Two Times A Day 8)  Cyclobenzaprine Hcl 10 Mg Tabs (Cyclobenzaprine Hcl) .... One By Mouth Q Hs 9)  Ambien 10 Mg Tabs (Zolpidem Tartrate) .Marland Kitchen.. 1 At Bedtime As Needed 10)  Azor 10-40 Mg Tabs (Amlodipine-Olmesartan) .... One By Mouth Daily  Current  Medications (verified): 1)  Lexapro 10 Mg  Tabs (Escitalopram Oxalate) .... Once Daily 2)  Lipitor 10 Mg  Tabs (Atorvastatin Calcium) .... 1/2 Tab By Mouth Daily 3)  Nexium 40 Mg  Cpdr (Esomeprazole Magnesium) .... Once Daily 4)  Plavix 75 Mg  Tabs (Clopidogrel Bisulfate) .... Once Daily 5)  Catapres-Tts-2 0.2 Mg/24hr Ptwk (Clonidine Hcl) .... Apply Weekly 6)  Alprazolam 0.25 Mg  Tabs (Alprazolam) .... One By Mouth Bid 7)  Carvedilol 25 Mg Tabs (Carvedilol) .... 1/2 By Mouth Two Times A Day 8)  Cyclobenzaprine Hcl 10 Mg Tabs (Cyclobenzaprine Hcl) .... One By Mouth Q Hs 9)  Ambien 10 Mg Tabs (Zolpidem Tartrate) .Marland Kitchen.. 1 At Bedtime As Needed 10)  Azor 10-40 Mg Tabs (Amlodipine-Olmesartan) .... One By Mouth Daily  Allergies (verified): No Known Drug Allergies  Past History:  Family History: Last updated: 10/03/2006 father died from cancer mother  alzheimer's  Social History: Last updated: Oct 03, 2006 Retired Former Smoker widowed  Risk Factors: Smoking Status: quit (08/30/2009) Packs/Day: 0.5 (08/30/2009)  Past medical, surgical, family and social histories (including risk factors) reviewed, and no changes noted (except as noted below).  Past Medical History: Reviewed history from 03/09/2007 and no changes required. Hyperlipidemia Cerebrovascular accident, hx of-Right hemiparesis Hypertension Osteoarthritis Peripheral vascular disease blind in left eye Allergic rhinitis GERD  Past Surgical History: Reviewed history from 10-03-2006 and no changes required. Carotid endarterectomy2003  right foot surgery right metatarsal amputation right knee arthroscopy  Family History: Reviewed history from 10-03-06 and no changes required. father died from cancer mother  alzheimer's  Social History: Reviewed history from 10-03-06 and no changes required. Retired Former Smoker widowed  Review of Systems       The patient complains of muscle weakness and difficulty walking.   The patient denies anorexia, fever, weight loss, weight gain, vision loss, decreased hearing, hoarseness, chest pain, syncope, dyspnea on exertion, peripheral edema, prolonged cough, headaches, hemoptysis, abdominal pain, melena, hematochezia, severe indigestion/heartburn, hematuria, incontinence, genital sores, suspicious skin lesions, transient blindness, and depression.    Physical Exam  General:  well-nourished, well-hydrated, and pale.   Head:  normocephalic and no abnormalities observed.   Eyes:  pupils equal and pupils reactive to light.   Ears:  R ear normal and L ear normal.   Nose:  no nasal discharge.   Mouth:  good dentition and pharynx pink and moist.   Neck:  No deformities, masses, or tenderness noted. Lungs:  normal respiratory effort and no wheezes.   Heart:  normal rate and regular rhythm.   Abdomen:  soft and non-tender.   Msk:  decreased ROM and joint tenderness.  right knee Extremities:  trace left pedal edema and trace right pedal edema.     Detailed Neurologic Exam  Speech:    dysarthria.   Cognition:    short-term memory deficit and poor attention.   Cranial Nerves:    normal visual acuity and muscles of mastication are normal.   Coordination:  intention tremor and abnormal finger to nose on right.   Gait:    hemiparetic gait.     Impression & Recommendations:  Problem # 1:  HYPERLIPIDEMIA (ICD-272.4)  Her updated medication list for this problem includes:    Lipitor 10 Mg Tabs (Atorvastatin calcium) .Marland Kitchen... 1/2 tab by mouth daily  Labs Reviewed: SGOT: 19 (04/23/2009)   SGPT: 14 (04/23/2009)  Lipid Goals: Chol Goal: 200 (12/10/2006)   HDL Goal: 40 (12/10/2006)   LDL Goal: 100 (12/10/2006)   TG Goal: 150 (12/10/2006)  Prior 10 Yr Risk Heart Disease: 20 % (09/25/2008)   HDL:38.70 (04/23/2009), 32.80 (07/23/2008)  LDL:74 (06/17/2006)  Chol:204 (04/23/2009), 171 (07/23/2008)  Trig:121 (06/17/2006)  Problem # 2:  LOC OSTEOARTHROS NOT SPEC PRIM/SEC  LOWER LEG (ICD-715.36) severe pain in the right knee requestion injecton fr pain wth know hx of OA Discussed use of medications, application of heat or cold, and exercises.   Orders: Joint Aspirate / Injection, Large (20610) Depo- Medrol 40mg  (J1030) Informed consen obtained and then the right knee  joint was prepped in a sterile manor and 40 mg depo and 1/2 cc 1% lidocaine injected into the synovial space. After care discussed. Pt tolerated procedure well.  Problem # 3:  HYPERTENSION, MALIGNANT ESSENTIAL (ICD-401.0)  Her updated medication list for this problem includes:    Catapres-tts-2 0.2 Mg/24hr Ptwk (Clonidine hcl) .Marland Kitchen... Apply weekly    Carvedilol 25 Mg Tabs (Carvedilol) .Marland Kitchen... 1/2 by mouth two times a day    Azor 10-40 Mg Tabs (Amlodipine-olmesartan) ..... One by mouth daily  BP today: 140/80 Prior BP: 140/80 (04/23/2009)  Prior 10 Yr Risk Heart Disease: 20 % (09/25/2008)  Labs Reviewed: K+: 3.8 (04/23/2009) Creat: : 1.3 (04/23/2009)   Chol: 204 (04/23/2009)   HDL: 38.70 (04/23/2009)   LDL: 74 (06/17/2006)   TG: 121 (06/17/2006)  Problem # 4:  LOC OSTEOARTHROS NOT SPEC PRIM/SEC LOWER LEG (ICD-715.36)  Informed consen obtained and then the joint was prepped in a sterile manor and 40 mg depo and 1/2 cc 1% lidocaine injected into the synovial space. After care discussed. Pt tolerated procedure well.  Discussed use of medications, application of heat or cold, and exercises.   Orders: Joint Aspirate / Injection, Large (20610) Depo- Medrol 40mg  (J1030)  Problem # 5:  CEREBROVASCULAR DISEASE LATE EFFECTS, NOS (ICD-438.9)  Her updated medication list for this problem includes:    Plavix 75 Mg Tabs (Clopidogrel bisulfate) ..... Once daily  Complete Medication List: 1)  Lexapro 10 Mg Tabs (Escitalopram oxalate) .... Once daily 2)  Lipitor 10 Mg Tabs (Atorvastatin calcium) .... 1/2 tab by mouth daily 3)  Nexium 40 Mg Cpdr (Esomeprazole magnesium) .... Once daily 4)  Plavix 75  Mg Tabs (Clopidogrel bisulfate) .... Once daily 5)  Catapres-tts-2 0.2 Mg/24hr Ptwk (Clonidine hcl) .... Apply weekly 6)  Alprazolam 0.25 Mg Tabs (Alprazolam) .... One by mouth bid 7)  Carvedilol 25 Mg Tabs (Carvedilol) .... 1/2 by mouth two times a day 8)  Cyclobenzaprine Hcl 10 Mg Tabs (Cyclobenzaprine hcl) .... One by mouth q hs 9)  Ambien 10 Mg Tabs (Zolpidem tartrate) .Marland Kitchen.. 1 at bedtime as needed 10)  Azor 10-40 Mg Tabs (Amlodipine-olmesartan) .... One by mouth daily  Hypertension Assessment/Plan:      The patient's hypertensive risk group is category C: Target organ damage and/or diabetes.  Her calculated 10 year risk of coronary heart disease is 13 %.  Today's blood pressure is 140/80.  Her blood pressure goal is < 140/90.  Lipid Assessment/Plan:      Based on NCEP/ATP III, the patient's risk factor category is "history of coronary disease, peripheral vascular disease, cerebrovascular disease, or aortic aneurysm".  The patient's lipid goals are as follows: Total cholesterol goal is 200; LDL cholesterol goal is 100; HDL cholesterol goal is 40; Triglyceride goal is 150.  Her LDL cholesterol goal has been met.    Patient Instructions: 1)  Please schedule a follow-up appointment in 4 months. 2)  get a colace suppository at the drug  for the constipation

## 2010-03-11 NOTE — Progress Notes (Signed)
Summary: Pt is req work in appt with Dr.Jenkins only for uti & inf finger  Phone Note Call from Patient Call back at Home Phone [phone]   Caller: caregiver   - Steward Drone Reason for Call: Acute Illness Summary of Call: Pt req work in appt with Dr. Lovell Sheehan only. Pt has ? uti and also pts little finger is infected around nail.  Pt refuses to see another doctor.       Initial call taken by: Lucy Antigua,  November 25, 2009 2:25 PM  Follow-up for Phone Call        ov given  Follow-up by: Willy Eddy, LPN,  November 25, 2009 2:36 PM

## 2010-03-11 NOTE — Assessment & Plan Note (Signed)
Summary: uti and nail infection/bmw   Vital Signs:  Patient profile:   70 year old female Weight:      145 pounds Temp:     98.0 degrees F oral BP sitting:   166 / 80  (left arm) Cuff size:   regular  Vitals Entered By: Sid Falcon LPN (November 26, 2009 10:48 AM)  History of Present Illness: Patient seen for the following issues.  Onset 3 days ago of some burning with urination and some urine frequency. No vaginal discharge. Denies any fever or chills. Unable to give urine specimen here thus far.  No incontinence.  Left fifth digit pain on the border of her nail. Has noticed some redness in recent days. Soaking with Epsom salts and warm water without improvement. No fever. No purulent drainage.  Painful digit for about one month.  No hx of injury.  Allergies (verified): 1)  Codeine Sulfate (Codeine Sulfate)  Past History:  Past Medical History: Last updated: 03/09/2007 Hyperlipidemia Cerebrovascular accident, hx of-Right hemiparesis Hypertension Osteoarthritis Peripheral vascular disease blind in left eye Allergic rhinitis GERD  Past Surgical History: Last updated: 10/14/2006 Carotid endarterectomy2003  right foot surgery right metatarsal amputation right knee arthroscopy  Family History: Last updated: 10/14/2006 father died from cancer mother  alzheimer's  Social History: Last updated: 10/14/2006 Retired Former Smoker widowed  Risk Factors: Smoking Status: quit (08/30/2009) Packs/Day: 0.5 (08/30/2009) PMH-FH-SH reviewed for relevance  Review of Systems  The patient denies anorexia, fever, abdominal pain, hematuria, and incontinence.    Physical Exam  General:  Well-developed,well-nourished,in no acute distress; alert,appropriate and cooperative throughout examination Neck:  No deformities, masses, or tenderness noted. Lungs:  Normal respiratory effort, chest expands symmetrically. Lungs are clear to auscultation, no crackles or wheezes. Heart:   normal rate and regular rhythm.   Msk:  no CVA tenderness. Extremities:  left fifth digit reveals that she has some mild erythema distal to the DIP joint. She has some tenderness on the lateral border of the nail some thickened hyperkeratotic tissue and some whitish discoloration near the base of the nail suggesting some possible purulence   Impression & Recommendations:  Problem # 1:  DYSURIA (ICD-788.1) pt unable to given urine sample in office.  See below.  Finger infection and will cover with Keflex. Her updated medication list for this problem includes:    Cephalexin 500 Mg Caps (Cephalexin) ..... One by mouth three times a day for 10 days  Problem # 2:  INGROWING NAIL (ICD-703.0)  patient has possible ingrown corner of  left fifth digit nail with some hyperkeratotic tissue and question of evolving abscess base of nail. We have recommended anesthesia with digital block and  removing corner of the nail to further explore and patient consents. Digital block left fifth digit 1% Xylocaine without epinephrine. Finger prepped with Betadine. Using straight hemostats and surgical scissors excised the involved portion of nail and cleaned out base. No purulent drainage noted. ?early cellulitis  Her updated medication list for this problem includes:    Cephalexin 500 Mg Caps (Cephalexin) ..... One by mouth three times a day for 10 days  Orders: Nail avulsion, partial or complete (11730)  Complete Medication List: 1)  Lexapro 10 Mg Tabs (Escitalopram oxalate) .... Once daily 2)  Lipitor 10 Mg Tabs (Atorvastatin calcium) .... 1/2 tab by mouth daily 3)  Nexium 40 Mg Cpdr (Esomeprazole magnesium) .... Once daily 4)  Plavix 75 Mg Tabs (Clopidogrel bisulfate) .... Once daily 5)  Catapres-tts-2 0.2 Mg/24hr Ptwk (Clonidine  hcl) .... Apply weekly 6)  Alprazolam 0.25 Mg Tabs (Alprazolam) .... One by mouth bid 7)  Carvedilol 25 Mg Tabs (Carvedilol) .... 1/2 by mouth two times a day 8)  Cyclobenzaprine  Hcl 10 Mg Tabs (Cyclobenzaprine hcl) .... One by mouth q hs 9)  Ambien 10 Mg Tabs (Zolpidem tartrate) .Marland Kitchen.. 1 at bedtime as needed 10)  Azor 10-40 Mg Tabs (Amlodipine-olmesartan) .... One by mouth daily 11)  Cephalexin 500 Mg Caps (Cephalexin) .... One by mouth three times a day for 10 days  Other Orders: Admin 1st Vaccine (16109) Flu Vaccine 52yrs + [phone])  Patient Instructions: 1)  Continue salt water soaks for left fifth finger for the next few days and topical antibiotic daily 2)  Drink plenty of fluids up to 3-4 quarts a day. Cranberry juice is especially recommended in addition to large amounts of water. Avoid caffeine & carbonated drinks, they tend to irritate the bladder, Return in 3-5 days if you're not better: sooner if you're feeling worse.  Prescriptions: CEPHALEXIN 500 MG CAPS (CEPHALEXIN) one by mouth three times a day for 10 days  #30 x 0   Entered and Authorized by:   Evelena Peat MD   Signed by:   Evelena Peat MD on 11/26/2009   Method used:   Electronically to        [facility]  [address] [phone]* (retail)       [address]       [place], [place]  [address]       Ph: [phone] or [phone]       Fax: [phone]   RxID:   [id number]    Orders Added: 1)  Admin 1st Vaccine [90471] 2)  Flu Vaccine 48yrs + [25956] 3)  Est. Patient Level III [38756] 4)  Nail avulsion, partial or complete [11730]  Flu Vaccine Consent Questions     Do you have a history of severe allergic reactions to this vaccine? no    Any prior history of allergic reactions to egg and/or gelatin? no    Do you have a sensitivity to the preservative Thimersol? no    Do you have a past history of Guillan-Barre Syndrome? no    Do you currently have an acute febrile illness? no    Have you ever had a severe reaction to latex? no    Vaccine information given and explained to patient? yes    Are you currently pregnant? no    Lot Number:[id number]   Exp Date:08/09/2010    Site Given  Left Deltoid IM        .[email or web address] [email or web address]

## 2010-03-13 NOTE — Progress Notes (Signed)
Summary:  increased vaginal issues.  Phone Note Call from Patient Call back at Home Phone [phone]   Caller: Daughter---triage vm Reason for Call: Acute Illness Summary of Call: having increased vaginal issues. has used 2 abxs, monistat 7. please advise.     [facility] Initial call taken by: Warnell Forester,  January 20, 2010 11:13 AM  Follow-up for Phone Call        to gyn - [facility] number given to brenda and they will make appointment Follow-up by: Willy Eddy, LPN,  January 20, 2010 12:22 PM

## 2010-04-24 ENCOUNTER — Encounter: Payer: Self-pay | Admitting: Internal Medicine

## 2010-04-25 ENCOUNTER — Encounter: Payer: Self-pay | Admitting: Internal Medicine

## 2010-04-25 ENCOUNTER — Ambulatory Visit: Payer: Medicare Other | Admitting: Internal Medicine

## 2010-04-25 DIAGNOSIS — L03019 Cellulitis of unspecified finger: Secondary | ICD-10-CM

## 2010-04-25 DIAGNOSIS — I6990 Unspecified sequelae of unspecified cerebrovascular disease: Secondary | ICD-10-CM

## 2010-04-25 DIAGNOSIS — L02519 Cutaneous abscess of unspecified hand: Secondary | ICD-10-CM

## 2010-04-25 DIAGNOSIS — E785 Hyperlipidemia, unspecified: Secondary | ICD-10-CM

## 2010-04-25 DIAGNOSIS — K219 Gastro-esophageal reflux disease without esophagitis: Secondary | ICD-10-CM

## 2010-04-25 MED ORDER — AMOXICILLIN-POT CLAVULANATE 875-125 MG PO TABS: 1.0000 | ORAL_TABLET | Freq: Two times a day (BID) | ORAL | Status: AC

## 2010-04-25 NOTE — Assessment & Plan Note (Signed)
Infected 5th digit of the right hand

## 2010-05-12 ENCOUNTER — Telehealth: Payer: Self-pay | Admitting: *Deleted

## 2010-05-12 DIAGNOSIS — L089 Local infection of the skin and subcutaneous tissue, unspecified: Secondary | ICD-10-CM

## 2010-05-12 NOTE — Telephone Encounter (Signed)
Per dr Lovell Sheehan- refer to dr sypher office

## 2010-05-12 NOTE — Telephone Encounter (Signed)
Infected finger is no better since course of Amoxicillin. Still red, and throbbing.

## 2010-05-15 ENCOUNTER — Telehealth: Payer: Self-pay | Admitting: Internal Medicine

## 2010-05-15 NOTE — Telephone Encounter (Signed)
Talked with pam and told her to tell surgeon she needs rehab and the office will tell her who to contact

## 2010-05-15 NOTE — Telephone Encounter (Signed)
Pt is having surgery on finger 05/23/10. 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 Steward Drone wants to know what she needs to do in order to get pt in rehab after surgery on finger? Steward Drone asked Pam, to call about this, in order not to upset Zenia. Pls call Pam at [phone] or [phone] asap.

## 2010-05-18 LAB — DIFFERENTIAL
Basophils Relative: 0 % (ref 0–1)
Lymphs Abs: 1.2 10*3/uL (ref 0.7–4.0)
Monocytes Absolute: 0.4 10*3/uL (ref 0.1–1.0)
Monocytes Relative: 5 % (ref 3–12)
Neutro Abs: 6.3 10*3/uL (ref 1.7–7.7)

## 2010-05-18 LAB — URINE MICROSCOPIC-ADD ON

## 2010-05-18 LAB — POCT I-STAT, CHEM 8
Calcium, Ion: 1.05 mmol/L — ABNORMAL LOW (ref 1.12–1.32)
Chloride: 107 mEq/L (ref 96–112)
Creatinine, Ser: 1.2 mg/dL (ref 0.4–1.2)
Glucose, Bld: 90 mg/dL (ref 70–99)
HCT: 42 % (ref 36.0–46.0)

## 2010-05-18 LAB — URINALYSIS, ROUTINE W REFLEX MICROSCOPIC
Bilirubin Urine: NEGATIVE
Ketones, ur: NEGATIVE mg/dL
Nitrite: NEGATIVE
Urobilinogen, UA: 0.2 mg/dL (ref 0.0–1.0)

## 2010-05-18 LAB — CBC
Hemoglobin: 14.2 g/dL (ref 12.0–15.0)
MCHC: 33.2 g/dL (ref 30.0–36.0)
MCV: 96.9 fL (ref 78.0–100.0)
RBC: 4.43 MIL/uL (ref 3.87–5.11)

## 2010-05-21 ENCOUNTER — Other Ambulatory Visit: Payer: Self-pay | Admitting: Internal Medicine

## 2010-05-22 ENCOUNTER — Encounter (HOSPITAL_BASED_OUTPATIENT_CLINIC_OR_DEPARTMENT_OTHER)
Admission: RE | Admit: 2010-05-22 | Discharge: 2010-05-22 | Disposition: A | Discharge status: Home / Self Care | Payer: PRIVATE HEALTH INSURANCE | Source: Ambulatory Visit | Attending: Orthopedic Surgery | Admitting: Orthopedic Surgery

## 2010-05-22 LAB — BASIC METABOLIC PANEL
BUN: 17 mg/dL (ref 6–23)
CO2: 28 mEq/L (ref 19–32)
Chloride: 106 mEq/L (ref 96–112)
Creatinine, Ser: 1.34 mg/dL — ABNORMAL HIGH (ref 0.4–1.2)
Glucose, Bld: 116 mg/dL — ABNORMAL HIGH (ref 70–99)

## 2010-05-23 ENCOUNTER — Other Ambulatory Visit: Payer: Self-pay | Admitting: Orthopedic Surgery

## 2010-05-23 ENCOUNTER — Ambulatory Visit (HOSPITAL_BASED_OUTPATIENT_CLINIC_OR_DEPARTMENT_OTHER)
Admission: RE | Admit: 2010-05-23 | Discharge: 2010-05-23 | Disposition: A | Discharge status: Home / Self Care | Payer: PRIVATE HEALTH INSURANCE | Source: Ambulatory Visit | Attending: Orthopedic Surgery | Admitting: Orthopedic Surgery

## 2010-05-23 DIAGNOSIS — I10 Essential (primary) hypertension: Secondary | ICD-10-CM | POA: Insufficient documentation

## 2010-05-23 DIAGNOSIS — I69939 Monoplegia of upper limb following unspecified cerebrovascular disease affecting unspecified side: Secondary | ICD-10-CM | POA: Insufficient documentation

## 2010-05-23 DIAGNOSIS — IMO0001 Reserved for inherently not codable concepts without codable children: Secondary | ICD-10-CM | POA: Insufficient documentation

## 2010-05-23 DIAGNOSIS — C4910 Malignant neoplasm of connective and soft tissue of unspecified upper limb, including shoulder: Secondary | ICD-10-CM | POA: Insufficient documentation

## 2010-05-27 NOTE — Op Note (Signed)
NAMEJULIYAH, Lori Fox                ACCOUNT NO.:  [id number]  MEDICAL RECORD NO.:  [id number]          PATIENT TYPE:  LOCATION:                                 FACILITY:  PHYSICIAN:  Lori Fitch. Lori Fox, M.D.      DATE OF BIRTH:  DATE OF PROCEDURE:  05/23/2010 DATE OF DISCHARGE:                              OPERATIVE REPORT   PREOPERATIVE DIAGNOSIS:  Fungating tumor, left small finger, distal phalangeal segment, with expansion of nail mechanism and erosion of distal phalanx.  POSTOPERATIVE DIAGNOSIS:  Soft tissue tumor eroding radial aspect of distal phalanx tuft, distal metaphysis, diaphysis, and proximal metaphysis with erosion of nail mechanism.  FINAL DIAGNOSIS:  Pending pathologic evaluation of incisional biopsy specimen.  OPERATION:  Incisional biopsy of fungating tumor, left small finger, radial pulp, and nail mechanism.  OPERATING SURGEON:  Lori Fitch. Lori Bundrick, MD  ASSISTANT:  Lori Reeks Dasnoit, PA  ANESTHESIA:  Left small finger digital block with 2% lidocaine supplemented by IV sedation, supervising anesthesiologist is Lori Person, MD  INDICATIONS:  Lori Fox is a 70 year old woman referred through the courtesy of Dr. Darryll Fox for evaluation and management of a swollen, painful left small finger pulp.  Lori Fox was thought to initially have a paronychia and has been treated with the course of antibiotics. As the time passed, she has noted expansion of her fingertip and a verrucous mass presenting beneath the nail.  She was subsequently referred for Hand Surgery consult.  Clinical examination revealed expansion of the pulp, a solid mass that was tender and elevation of the nail mechanism.  X-rays of the finger demonstrated erosion of the radial aspect of the tuft and metaphysis and diaphysis of the distal phalanx.  We explained to Lori Fox and her caretakers that this likely was a tumor or an area of chronic abscess.  We recommended incisional biopsy for  definitive diagnosis at this time.  After informed consent, she was brought to the operating room.  Preoperatively, she was advised of the potential risks and benefits of the surgery.  We pointed out that we could not offer prognosis until we had a histopathologic diagnosis.  Should this turn out to be a tumor with malignant features, we would need to perform a finger amputation to obtain margin.  Questions were invited and answered in detail.  DESCRIPTION OF PROCEDURE:  Lori Fox was brought to room #1 of the [facility] and placed in supine position on the operating table.  Following an anesthesia informed consent with Dr. Gypsy Fox, lidocaine, digital block anesthesia supplemented by IV sedation was recommended and accepted.  IV access was obtained with left antecubital fossa due to previous CVA leading to a flail right arm.  The left arm and hand were prepped with Betadine soap solution, sterilely draped.  A 2% lidocaine was infiltrated at the metacarpal head level to obtain a digital block.  After 5 minutes, excellent anesthesia was achieved.  Following routine surgical time-out, the finger was exsanguinated with a gauze wrap and a 1/2-inch Penrose drain was placed in the proximal phalangeal segment as a digital tourniquet.  Incisions were  planned to remove the bulk of the tumor, however, no effort was made to obtain a wide margin.  Due to the fact that this is an unknown and deep lesion, likely some type of neoplasm, an incisional biopsy was planned at this time.  The radial 3 mm of the nail matrix including the dorsal intermediate and ventral nail fold as well as a portion of the pulp skin was resected on block with the tumor.  The tumor was pseudoencapsulated, eroding the distal phalanx, and extended on the volar surface of the distal phalanx towards the flexor tendon insertion.  All visible tumor were shelled out, however, no effort was made to obtain a wide  margin.  The wound was inspected for bleeding points and subsequently dressed open with Xeroflo sterile gauze and a wrist base Coban dressing.  The specimen was placed in formalin and passed off for pathologic evaluation.  We will plan on seeing Ms. Lori Fox back in 3-5 days to discuss the biopsy results.  Based on the erosion of the distal phalanx and expansion of the nail mechanism, this may be a malignant lesion.  We may need to discuss a revision amputation of the finger and may need to proceed with a staging workup.     Lori Fox, M.D.     RVS/MEDQ  D:  05/23/2010  T:  05/24/2010  Job:  [id number]  cc:   Lori Glaze, MD  Electronically Signed by Lori Fox M.D. on 05/27/2010 09:16:19 AM

## 2010-06-10 ENCOUNTER — Ambulatory Visit (HOSPITAL_BASED_OUTPATIENT_CLINIC_OR_DEPARTMENT_OTHER): Admission: RE | Admit: 2010-06-10 | Payer: Medicare Other | Source: Ambulatory Visit | Admitting: Orthopedic Surgery

## 2010-06-17 ENCOUNTER — Telehealth: Payer: Self-pay | Admitting: *Deleted

## 2010-06-17 NOTE — Telephone Encounter (Signed)
Per dr Clemencia Course move site around and use cortisone cream otc at area after tak ing off

## 2010-06-17 NOTE — Telephone Encounter (Signed)
Lori Fox called stating pt is getting red patches and itching from her Catapres patches.

## 2010-06-19 ENCOUNTER — Other Ambulatory Visit: Payer: Self-pay | Admitting: Internal Medicine

## 2010-06-24 NOTE — Assessment & Plan Note (Signed)
[facility]                                 ON-CALL NOTE   Lori Fox, Lori Fox                         MRN:          [id number]  DATE:10/04/2006                            DOB:          Aug 06, 1940    CALLER:  Caregiver, Siri Cole.   TELEPHONE NUMBER:  [phone]   PRIMARY CARE PHYSICIAN:  Dr. Lovell Sheehan   TIME OF CALL:  1:38am   The caregiver calls because [name]'s blood pressure keeps going up.  She used to be on Ziac and it was changed to Bistolic about a week ago.  Her blood pressure has been running high continuously since that time,  so on Friday she was told to give her 2 Bistolic 5 mg daily instead of  1. Blood pressure is still going up and she calls me now because it was  last 240/104. She is not having any symptoms. There is no headache. No  vision changes. No chest pain. She has had a stroke in the past and the  caregiver felt that she may need some intervention.   PLAN:  I told her that this medicine does not seem to be doing the job  now and it is higher now than it was on the Ziac, so clearly she needs a  change. The caregiver says that the status of her blood pressure cuff  has been checked to make sure that it is working properly. I have seen  an improper cuff frequently in this situation and that certainly is also  a possibility. I told her that if she has any symptoms at all that she  could call 911 and have her evaluated in the emergency room, but at this  point, it might be reasonable to wait until the office opens in a few  hours and have her seen today to change her medicines or add something  else. Apparently, she is not on anything else for her blood pressure.     Karie Schwalbe, MD  Electronically Signed    RIL/MedQ  DD: 10/04/2006  DT: 10/04/2006  Job #: [id number]   cc:   Stacie Glaze, MD

## 2010-06-27 NOTE — H&P (Signed)
[place]. [facility]  Patient:    Lori Fox, Lori Fox Visit Number: [id number] MRN: [id number]          Service Type: DSU Location: [facility]UA 2926 01 Attending Physician:  Bennye Alm Dictated by:   Lissa Merlin, P.A. Admit Date:  08/05/2001 Discharge Date: 08/06/2001   CC:         Stacie Glaze, M.D. [facility]   History and Physical  DATE OF BIRTH:  11-30-1940  PRIMARY CARE PHYSICIAN:  Dr. Stacie Glaze.  CHIEF COMPLAINT:  Severe right internal carotid artery stenosis.  HISTORY OF PRESENT ILLNESS:  This is a 70 year old white married female who has a previous history of left internal carotid artery occlusion and left hemispheric CVA approximately seven years ago.  She had resulting right hemiparesis.  She had been in her usual state of health when a right carotid bruit was picked up on physical exam recently.  She was referred to CVTS, where a Doppler exam confirmed severe right internal carotid artery stenosis. She saw Dr. Denman George, July 18, 2001.  He scheduled a MRI, which confirmed the severe disease.  Dr. Madilyn Fireman recommended right carotid endarterectomy.  This was discussed with the patient and it has been agreed to proceed on August 16, 2001.  The patient has been in her usual state of health with no new complaints and presented today for her routine presurgical tests.  PAST MEDICAL HISTORY:  1. 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  2. 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  3. Clubfoot.  4. Hypertension.  5. Hyperlipidemia.  6. Degenerative joint disease.  (She denies a history of coronary artery disease, MI, diabetes, arrhythmia, thyroid problems, asthma, COPD.)  PAST SURGICAL HISTORY:  1. Right foot transmetatarsal amputation.  2. Right knee arthroscopy.  SOCIAL HISTORY:  She is married with a supportive husband.  She used to  work at [employer] before her stroke but is now on disability.  She quit smoking 7 years ago after 76 pack years, starting at 70 years old.  She denies a history of alcohol use.  She is normally up and around at home with a cane.  FAMILY HISTORY:  She has one daughter who is healthy at 32 years old.  Her mother is in a nursing home with Alzheimers; she is 70 years old.  Her father is 54 years old; he has had an MI but is very active.  She has a brother who is deceased from brain cancer and another brother that is living.  REVIEW OF SYSTEMS:  She denies fevers, chills or weight loss.  She denies chest pain or palpitations.  She denies cough, shortness of breath.  She denies visual changes.  She denies neurological symptoms.  She denies GI or GU symptoms.  MEDICATIONS:  1. Enteric-coated aspirin 81 mg one p.o. q.d.  2. Verapamil 240 mg one p.o. q.d.  3. Lipitor 10 mg one p.o. q.d.  4. Altace 5 mg one p.o. q.d.  5. Zyrtec 10 mg one p.o. q.d.  6. Paxil 20 mg one p.o. q.d.  7. Coumadin -- alternating doses of 3 mg, 3 mg and 4 mg on subsequent days.  8. Zyprexa 2.5 mg b.i.d.  9. Darvocet p.r.n. 10. Hydroxyzine 25 mg b.i.d. p.r.n.  ALLERGIES:  CODEINE and MORPHINE cause hives.  LABORATORY AND ACCESSORY DATA:  Doppler exam on July 12, 2001 showed 80-99% right ICA stenosis and an occluded left ICA.  ASSESSMENT AND PLAN:  This is a 70 year old female with known left internal carotid artery occlusion and previous left brain cerebrovascular accident and hemiparesis.  She now presents with finding of severe right internal carotid artery stenosis, asymptomatic.  Because of her severe stenosis and her previous left-side occlusion, Dr. Madilyn Fireman recommends proceeding with right carotid endarterectomy.  This has been discussed, including risks, benefits, details and alternatives, and it is agreed to proceed.  She has undergone her routine presurgical tests, she has been feeling well with no  symptoms and is stable for surgery. Dictated by:   Lissa Merlin, P.A. Attending Physician:  Bennye Alm DD:  08/11/01 TD:  08/15/01 Job: [id number]

## 2010-06-27 NOTE — Op Note (Signed)
[place]. [facility]  Patient:    Lori Fox, Lori Fox Visit Number: [id number] MRN: [id number]          Service Type: SUR Location: 2000 2022 01 Attending Physician:  Melvenia Needles Dictated by:   Denman George, M.D. Proc. Date: 08/16/01 Admit Date:  08/16/2001 Discharge Date: 08/18/2001   CC:         Stacie Glaze, M.D. [facility]   Operative Report  PREOPERATIVE DIAGNOSIS:  Severe right internal carotid artery stenosis.  POSTOPERATIVE DIAGNOSIS:  Severe right internal carotid artery stenosis.  PROCEDURE:  Right carotid endarterectomy, Dacron patch angioplasty.  SURGEON:  Denman George, M.D.  ASSISTANTS:  Di Kindle. Edilia Bo, M.D., and Tollie Pizza. Collins, P.A.-C.  ANESTHESIA:  General endotracheal.  ANESTHESIOLOGIST:  Cliffton Asters. Ivin Booty, M.D.  CLINICAL NOTE:  This is a 70 year old female with a history of severe disabling left hemispheric CVA with right hemiparesis.  She has returned to a moderate functional level and is living at home.  She recently underwent Doppler evaluation, which revealed severe right internal carotid artery stenosis with left internal carotid artery occlusion.  Arteriography verified these findings.  She is brought to the operating room at this time for right carotid endarterectomy for reduction of stroke risk.  She consented for surgery. Risks and benefits of this procedure explained to the patient in detail.  The major morbidity and mortality associated with this operation of approximately 1-2%, to include but not limited to MI, CVA, or death.  DESCRIPTION OF PROCEDURE:  Patient brought to the operating room in stable condition.  Placed in the supine position.  General endotracheal anesthesia induced.  Foley catheter and arterial line in place.  Right neck prepped and draped in a sterile fashion.  Curvilinear skin incision made along the anterior border of the right sternomastoid muscle.  Subcutaneous tissue and  platysma divided with electrocautery.  Deep dissection carried along the sternomastoid muscle to the carotid sheath.  The common carotid artery mobilized proximally and encircled with a vessel loop.  Vagus nerve reflected posteriorly and preserved.  Distal dissection carried up to the carotid bifurcation.  The external carotid artery encircled with a vessel loop.  The internal carotid artery then followed distally up to the posterior belly of the digastric muscle.  The hypoglossal nerve identified and preserved.  The distal internal carotid artery encircled with a vessel loop.  The carotid bifurcation revealed a large amount of calcific plaque with a large plaque extending into the origin of the right internal carotid artery.  The patient was administered 7000 units of heparin intravenously.  Adequate circulation time permitted.  The carotid vessels controlled with clamps.  A longitudinal arteriotomy made in the distal common carotid artery.  The arteriotomy extended across the carotid bulb and up into the internal carotid artery, where there was a severe stenosis associated with a large calcified plaque.  Stenosis was greater than 80%.  The arteriotomy was extended in the internal carotid artery beyond the plaque.  A shunt was then inserted.  An endarterectomy elevator was then used to remove the plaque.  The endarterectomy carried down into the common carotid artery, where the plaque was divided transversely with Potts scissors.  Plaque then raised up into the bulb, where the external carotid was endarterectomized using an eversion technique.  Plaque then removed from the internal carotid artery and feathered out distally.  Fragments of plaque were removed with plaque forceps.  The site irrigated with heparin and saline solution.  A Finesse patch was then placed over the endarterectomy site using running 6-0 Prolene suture.  At completion of the patch angioplasty, the shunt  was removed.  All vessels were flushed and initial antegrade flow was directed up the external carotid artery.  The internal carotid artery then released. There was an excellent pulse and Doppler signal in the distal internal carotid artery.  The patient administered 50 mg of protamine intravenously, adequate hemostasis obtained.  Sponge and instrument counts were correct.  The sternomastoid fascia closed with running 2-0 Vicryl suture.  The platysma closed with running 3-0 Vicryl suture.  The skin closed with 4-0 Monocryl. Sterile dressing applied.  The patient tolerated the procedure well.  Anesthesia was reversed and returned to preoperative baseline neurologic level.  Transferred to the recovery room in stable condition, no apparent complications. Dictated by:   Denman George, M.D. Attending Physician:  Melvenia Needles DD:  08/16/01 TD:  08/19/01 Job: [id number]

## 2010-06-27 NOTE — Discharge Summary (Signed)
NAME:  Lori Fox, Lori Fox                          ACCOUNT NO.:  [id number]   MEDICAL RECORD NO.:  [id number]                   PATIENT TYPE:  INP   LOCATION:  2022                                 FACILITY:  [facility]   PHYSICIAN:  Gina L. Thomasena Edis, P.A.               DATE OF BIRTH:  1940-11-17   DATE OF ADMISSION:  08/16/2001  DATE OF DISCHARGE:  08/18/2001                                 DISCHARGE SUMMARY   PRIMARY ADMITTING DIAGNOSIS:  Severe right internal carotid artery stenosis.   ADDITIONAL DIAGNOSES:  1. History of left hemispheric cerebrovascular accident approximately seven     years ago.  2. Occluded left internal carotid artery.  3. Hypertension.  4. Hyperlipidemia.  5. Degenerative joint disease.  6. Blindness in left eye from birth.   PROCEDURE PERFORMED:  Right carotid endarterectomy with Dacron patch  angioplasty.   HISTORY:  The patient is a 70 year old white female who has a history of  left hemispheric CVA approximately seven years ago.  She had had a known  left internal carotid artery occlusion since that time.  On recent physical  exam, she was noted to have a right carotid bruit.  She has been  asymptomatic since her previous stroke.  She was referred to Dr. Liliane Bade  and was seen in the CVTS office where a Doppler study was performed showing  severe right internal carotid artery stenosis.  She had a follow up MRI  which also confirmed severe disease.  It was recommended at this time that  she proceed with a right carotid endarterectomy.   HOSPITAL COURSE:  She was admitted on August 16, 2001, and taken to the  operating room where she underwent a right carotid endarterectomy with  Dacron patch angioplasty.  She tolerated the procedure well and was  transferred from the PACU to the floor in stable condition.  Postoperatively, she has done well.  Her neurological status has remained  stable throughout her admission.  Her neck incision is healing well.  She  has  remained afebrile and all vital signs have been stable.  She has been  mobilized with the assistance of physical therapy and is doing quite well.  She has been tolerating a regular diet and is having normal bowel and  bladder function.  On August 18, 2001, she was deemed ready for discharge  home.   DISCHARGE MEDICATIONS:  1. Plavix 75 mg q.d.  2. Verapamil 240 mg q.d.  3. Lipitor 2 mg q.d.  4. Altace 5 mg q.d.  5. Zyrtec 10 mg q.d.  6. Paxil 10 mg q.a.m.  7. Zyprexa 2.5 mg b.i.d.  8. Hydroxyzine 25 mg b.i.d. p.r.n.  9. Tylox one to two q.4h. P.r.n. for pain.   DISCHARGE INSTRUCTIONS:  She is to refrain from driving, heavy lifting, or  strenuous activity.  She may continue daily walking and use of incentive  spirometry.  She is asked to continue her same preoperative diet.  She may  shower daily, clean her incisions with soap and water.   DISCHARGE FOLLOWUP:  She will be seen in the CVTS office in follow up by Dr.  Madilyn Fireman on Monday, August 29, 2001, at 10 a.m.  She is asked to call our office  if she has any problems or questions in the interim.                                                Gina L. Seward Meth.    GLC/MEDQ  D:  09/08/2001  T:  09/14/2001  Job:  [id number]   cc:   CVTS office   Stacie Glaze, M.D. [facility]

## 2010-06-27 NOTE — Discharge Summary (Signed)
[place]. [facility]  Patient:    Lori Fox, Lori Fox                         MRN: [id number] Adm. Date:  [id number] Disc. Date: 11/26/99 Attending:  Carrie Mew Dictator:   Cornell Barman, P.A. CC:         Stacie Glaze, M.D. [facility]                           Discharge Summary  DISCHARGE DIAGNOSES: 1. Atypical chest pain, myocardial infarction ruled out. 2. 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 3. Celiac adenopathy.  BRIEF ADMISSION HISTORY:  [name] is a 70 year old white female who presented with substernal chest pain, non-exertional, lasting 30 minutes.  It was severe and associated nausea and vomiting and relieved in the ER.  PAST MEDICAL HISTORY:  1. Panic attacks.  2. History of CVA in 1997 with a right hemiplegia.  3. Hypertension.  4. Clubbed foot on the right.  5. Chronic anticoagulation.  LABORATORIES ON ADMISSION:  The CBC and BMET were normal.  The CK was 62, the MB was 1, and the troponin I was less than 0.01.  The pro time was 21.4 and the INR was 2.6.  HOSPITAL COURSE: #1 - ATYPICAL CHEST PAIN:  Cardiac risk factors including hypertension, remote tobacco, and obesity.  The patient was admitted to rule out myocardial infarction.  Cardiac enzymes were negative x 3.  The EKG was without evidence of ischemia.  Therefore, we will pursue an outpatient Persantine Cardiolite in the next one to two days.  The patient was also started on some Protonix to rule out gastroesophageal reflux disease and some Vioxx for possible musculoskeletal etiology of her chest pain.  #2 - CELIAC ADENOPATHY:  The patient did have a CT of the chest that did not show any evidence of aortic dissection.  The patient was noted to have bilateral lower lobe ground glass appearance consistent with alveolitis.  The etiology of this is not clear.  The patient was also noted to have a 1 cm celiac node, question neoplastic,  recommending percutaneous biopsy.  We will discuss this with radiology and arrange for this to be done as an outpatient.  MEDICATIONS AT DISCHARGE: 1. Coumadin 3 mg q.d. 2. Protonix 40 mg q.d. for one month. 3. Vioxx 20 mg q.d. for seven days. 4. Calan 240 mg q.d. 5. Altace 5 mg q.d. 6. Paxil 20 mg q.d. 7. Lipitor 10 mg q.d. 8. Zyrtec 10 mg q.d. 9. Lopressor 50 mg q.d.  FOLLOW-UP:  Follow up with Stacie Glaze, M.D., in 7-10 days.  We will also arrange for a radiology guided percutaneous biopsy of the celiac node.  We will also arrange for an outpatient Persantine Cardiolite. DD:  11/26/99 TD:  11/26/99 Job: [id number]

## 2010-06-27 NOTE — Consult Note (Signed)
[place]. [facility]  Patient:    Lori Fox, Lori Fox Visit Number: [id number] MRN: [id number]          Service Type: DSU Location: [facility] 2926 01 Attending Physician:  Bennye Alm Dictated by:   Jonelle Sports Frazier Richards, M.D. Proc. Date: 08/06/01 Admit Date:  08/05/2001 Discharge Date: 08/06/2001   CC:         Di Kindle. Edilia Bo, M.D.  Denman George, M.D.   Consultation Report  CLINICAL DATA:  A 70 year old lady with a history of left hemispheric CVA with resulting right hemiparesis six to seven years ago.  The patient has known left ICA occlusion, and during recent exam, was found to have a right carotid bruit.  Doppler exam reportedly revealed severe right ICA stenosis.  EXAM:  Right carotid arteriogram:  The right common carotid artery that was injected without contrast media, and there is a high grade stenosis of the proximal right ICA a couple of centimeters distal to its origin.  Antegrade flow was noted up to the right ICA, and there is appropriate filling of right anterior and middle cerebral artery branches.  A1 and M1 segments appear unremarkable.  Cavernous and supraclinoid segments are unremarkable.  No intracranial arterial occlusion is detected.  There is an infundibulum at the site of origin of the right posterior communicating artery as an anatomic variant.  The left carotid artery was not injected.  No evidence of filling of the left anterior communicator or the left ACA or MCA from the right side injections.  IMPRESSION:  Unremarkable intracranial examination following right common carotid artery injection.  No right-sided intracranial arterial occlusion or high grade stenosis. Dictated by:   Jonelle Sports Frazier Richards, M.D. Attending Physician:  Bennye Alm DD:  08/06/01 TD:  08/08/01 Job: [id number]

## 2010-06-27 NOTE — Procedures (Signed)
[place]. [facility]  Patient:    Lori Fox, Lori Fox Visit Number: [id number] MRN: [id number]          Service Type: DSU Location: [facility]UA 2926 01 Attending Physician:  Bennye Alm Dictated by:   Di Kindle. Edilia Bo, M.D. Proc. Date: 08/05/01 Admit Date:  08/05/2001 Discharge Date: 08/06/2001   CC:         Denman George, M.D.  Stacie Glaze, M.D. [facility]  PV Lab, [facility]   Procedure Report  PROCEDURES PERFORMED: 1. Arch aortogram. 2. Right carotid arteriogram.  SURGEONS:  Di Kindle. Edilia Bo, M.D. and Denman George, M.D.  INDICATIONS:  The patient is a 70 year old woman who had sustained a left hemispheric stroke with right hemiparesis six to seven years ago. She had a known left internal carotid artery occlusion. She was found to have a right carotid bruit and a duplex scan showed a greater than 80% right carotid stenosis. An MRI suggested possibly some intracranial disease and cerebral arteriography was indicated in order to further assess her intracranial disease and right carotid stenosis. The procedure and potential complications, including a 1% to 2% risk of stroke were discussed with the patient preoperatively.  DESCRIPTION OF THE PROCEDURE:  The patient was taken to the [facility] laboratory at [facility] and sedated. Both groins were prepped and draped in the usual sterile fashion. After the skin was infiltrated with 1% lidocaine with the assistance of a Doppler, the left common femoral artery was cannulated and a 5 French sheath was introduced using Seldinger technique. The long pigtail catheter was then passed over a wire and positioned in the ascending aortic arch. An aortic guard projection was obtained at a 40 degree LAO.  Next the pigtail catheter was exchanged for an H1 catheter which was positioned in the innominate artery. The guide wire was carefully passed into the right common carotid artery  and then the catheter was advanced over the wire. A right carotid arteriogram was obtained with both AP and lateral intracranial and extracranial views obtained.  Next the catheter was withdrawn back into the aortic arch and several attempts were made to cannulate the left common carotid artery, however, this was not successful, and rather than persist, as it was felt that there was not pertinent given the known left internal carotid artery occlusion, we did not not persist. The catheter was then removed over a wire.  FINDINGS: Note the intracranial views will be dictated separately by the neuroradiolgist.  The arch arteriogram and extracranial views demonstrate no significant atherosclerotic disease of the aortic arch or innominate artery. The subclavian artery is widely patent without evidence of stenosis. The right common carotid artery is widely patent. The left common carotid artery is patent with an occluded internal carotid artery. The external carotid artery is patent. The left vertebral artery is patent. The right vertebral artery is not visualized.  There is a fairly long stenosis of the right internal carotid artery which begins at the bifurcation and extends over 2 to 3 cm. Above this stenosis the vessel was patent without evidence of significant internal carotid artery disease above this stenosis. The external carotid artery is patent with no significant stenosis identified. The stenosis of the right internal carotid artery measures approximately 90%.  CONCLUSIONS: 1. Tight 90% right internal carotid artery stenosis over a 2 to 3 cm segment    with a normal appearing internal carotid artery distally. 2. No significant aortic arch disease with possibly occluded  right vertebral    artery. 3. Occluded left internal carotid artery. Dictated by:   Di Kindle Edilia Bo, M.D. Attending Physician:  Bennye Alm DD:  08/05/01 TD:  08/07/01 Job:  [id number]

## 2010-07-08 ENCOUNTER — Telehealth: Payer: Self-pay | Admitting: *Deleted

## 2010-07-08 NOTE — Telephone Encounter (Signed)
Patches are making burns on pt's skin and she has tried everything Lori Fox told her to do.  [facility].

## 2010-07-09 MED ORDER — CLONIDINE HCL 0.2 MG PO TABS: 0.2000 mg | ORAL_TABLET | Freq: Three times a day (TID) | ORAL | Status: DC

## 2010-07-09 NOTE — Telephone Encounter (Signed)
Will have to go back to the clonidine  0.2 TID

## 2010-07-09 NOTE — Telephone Encounter (Signed)
Notified pt. 

## 2010-07-31 ENCOUNTER — Other Ambulatory Visit: Payer: Self-pay | Admitting: Internal Medicine

## 2010-08-14 ENCOUNTER — Other Ambulatory Visit: Payer: Self-pay | Admitting: *Deleted

## 2010-08-14 MED ORDER — CLOPIDOGREL BISULFATE 75 MG PO TABS: 75.0000 mg | ORAL_TABLET | Freq: Every day | ORAL | Status: DC

## 2010-08-15 ENCOUNTER — Other Ambulatory Visit: Payer: Self-pay | Admitting: Internal Medicine

## 2010-08-25 ENCOUNTER — Encounter: Payer: Self-pay | Admitting: Internal Medicine

## 2010-08-25 ENCOUNTER — Ambulatory Visit (INDEPENDENT_AMBULATORY_CARE_PROVIDER_SITE_OTHER): Payer: Medicare Other | Admitting: Internal Medicine

## 2010-08-25 VITALS — BP 146/78 | HR 72 | Temp 98.2°F | Resp 16 | Ht 67.0 in | Wt 145.0 lb

## 2010-08-25 DIAGNOSIS — I10 Essential (primary) hypertension: Secondary | ICD-10-CM

## 2010-08-25 DIAGNOSIS — I6990 Unspecified sequelae of unspecified cerebrovascular disease: Secondary | ICD-10-CM

## 2010-08-25 DIAGNOSIS — K219 Gastro-esophageal reflux disease without esophagitis: Secondary | ICD-10-CM

## 2010-08-25 DIAGNOSIS — E785 Hyperlipidemia, unspecified: Secondary | ICD-10-CM

## 2010-09-12 NOTE — Progress Notes (Signed)
Subjective:    Patient ID: Lori Fox, female    DOB: [birth year], 70 y.o.   MRN: [id number]  HPI  But again there is a 70 year old white female followed for hypertension hyperlipidemia and hemiparesis status post CVA.  She wears a brace on her left foot due to contractures and has weakness in her left upper extremity.  She is stable without a progressive neurological symptoms we have been following her blood pressure closely and assisting her with samples for compliance She is on 4 drugs amlodipine almost certain clonidine and Coreg with good blood pressure control.  She takes Lexapro for mild depression and she takes Crestor for cholesterol control all of these reduce her risk for recurrent stroke.  She has smoked in the past she states she is not smoking at this current time.    Review of Systems  Constitutional: Negative for activity change, appetite change and fatigue.  HENT: Negative for ear pain, congestion, neck pain, postnasal drip and sinus pressure.   Eyes: Negative for redness and visual disturbance.  Respiratory: Negative for cough, shortness of breath and wheezing.   Gastrointestinal: Negative for abdominal pain and abdominal distention.  Genitourinary: Negative for dysuria, frequency and menstrual problem.  Musculoskeletal: Negative for myalgias, joint swelling and arthralgias.  Skin: Negative for rash and wound.  Neurological: Negative for dizziness, weakness and headaches.  Hematological: Negative for adenopathy. Does not bruise/bleed easily.  Psychiatric/Behavioral: Negative for sleep disturbance and decreased concentration.   Past Medical History  Diagnosis Date  . Hyperlipidemia   . Stroke   . Hypertension   . Arthritis   . PVD (peripheral vascular disease)   . Blindness of left eye   . Allergy   . GERD (gastroesophageal reflux disease)    Past Surgical History  Procedure Date  . Carotid endarterectomy   . Foot surgery rt metatarsal   . Rt knee  arthroscopic     reports that she has quit smoking. She does not have any smokeless tobacco history on file. She reports that she does not drink alcohol or use illicit drugs. family history includes Alzheimer's disease in her mother and Cancer in her father. Allergies  Allergen Reactions  . Codeine Sulfate     REACTION: welts, hives        Objective:   Physical Exam  Vitals reviewed. Constitutional: She is oriented to person, place, and time. She appears well-developed and well-nourished. No distress.  HENT:  Head: Normocephalic and atraumatic.  Right Ear: External ear normal.  Left Ear: External ear normal.  Nose: Nose normal.  Mouth/Throat: Oropharynx is clear and moist.  Eyes: Conjunctivae and EOM are normal. Pupils are equal, round, and reactive to light.  Neck: Normal range of motion. Neck supple. No JVD present. No tracheal deviation present. No thyromegaly present.  Cardiovascular: Normal rate, regular rhythm, normal heart sounds and intact distal pulses.   No murmur heard. Pulmonary/Chest: Effort normal and breath sounds normal. She has no wheezes. She exhibits no tenderness.  Abdominal: Soft. Bowel sounds are normal.  Musculoskeletal: Normal range of motion. She exhibits no edema and no tenderness.  Lymphadenopathy:    She has no cervical adenopathy.  Neurological: She is alert and oriented to person, place, and time. She has normal reflexes. No cranial nerve deficit.  Skin: Skin is warm and dry. She is not diaphoretic.  Psychiatric: She has a normal mood and affect. Her behavior is normal.          Assessment & Plan:  Stable blood pressure control and for drugs discussed the use of her medications timing of her medications and compliance with medications and samples given to aid in her compliance.  Stable reflux on Nexium 40 mg by mouth daily stable anxiety depression Lexapro 10 mg daily.  Discussed use of Crestor for cholesterol and urge compliance with all of  her medications review of her diet and exercise her weight is stable and her blood pressure is well-controlled today followup in 4 months

## 2010-09-17 ENCOUNTER — Other Ambulatory Visit: Payer: Self-pay | Admitting: Internal Medicine

## 2010-12-16 ENCOUNTER — Other Ambulatory Visit: Payer: Self-pay | Admitting: *Deleted

## 2010-12-16 MED ORDER — ALPRAZOLAM 0.25 MG PO TABS: 0.2500 mg | ORAL_TABLET | Freq: Two times a day (BID) | ORAL | Status: DC

## 2010-12-26 ENCOUNTER — Ambulatory Visit: Payer: PRIVATE HEALTH INSURANCE | Admitting: Internal Medicine

## 2011-02-05 NOTE — Progress Notes (Signed)
  Subjective:    Patient ID: Lori Fox, female    DOB: 1940/06/10, 70 y.o.   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  HENT: Negative for ear pain, congestion, neck pain, postnasal drip and sinus pressure.   Eyes: Negative for redness and visual disturbance.  Respiratory: Negative for cough, shortness of breath and wheezing.   Gastrointestinal: Negative for abdominal pain and abdominal distention.  Genitourinary: Negative for dysuria, frequency and menstrual problem.  Musculoskeletal: Negative for myalgias, joint swelling and arthralgias.  Skin: Negative for rash and wound.  Neurological: Negative for dizziness, weakness and headaches.  Hematological: Negative for adenopathy. Does not bruise/bleed easily.  Psychiatric/Behavioral: Negative for sleep disturbance and decreased concentration.       Objective:   Physical Exam  Nursing note and vitals reviewed. Constitutional: She is oriented to person, place, and time. She appears well-developed and well-nourished. No distress.  HENT:  Head: Normocephalic and atraumatic.  Right Ear: External ear normal.  Left Ear: External ear normal.  Nose: Nose normal.  Mouth/Throat: Oropharynx is clear and moist.  Eyes: Conjunctivae and EOM are normal. Pupils are equal, round, and reactive to light.  Neck: Normal range of motion. Neck supple. No JVD present. No tracheal deviation present. No thyromegaly present.  Cardiovascular: Normal rate, regular rhythm, normal heart sounds and intact distal pulses.   No murmur heard. Pulmonary/Chest: Effort normal and breath sounds normal. She has no wheezes. She exhibits no tenderness.  Abdominal: Soft. Bowel sounds are normal.  Musculoskeletal: Normal  range of motion. She exhibits no edema and no tenderness.  Lymphadenopathy:    She has no cervical adenopathy.  Neurological: She is alert and oriented to person, place, and time. She has normal reflexes. No cranial nerve deficit.  Skin: Skin is warm and dry. She is not diaphoretic.  Psychiatric: She has a normal mood and affect. Her behavior is normal.          Assessment & Plan:  Stable blood pressureaffect from her CVA stable anxiety disorder continue to monitor hyperlipidemia on medication

## 2011-02-18 ENCOUNTER — Ambulatory Visit (INDEPENDENT_AMBULATORY_CARE_PROVIDER_SITE_OTHER): Payer: Medicare Other | Admitting: Internal Medicine

## 2011-02-18 ENCOUNTER — Encounter: Payer: Self-pay | Admitting: Internal Medicine

## 2011-02-18 VITALS — BP 160/80 | HR 72 | Temp 98.3°F | Resp 16 | Ht 67.0 in | Wt 147.0 lb

## 2011-02-18 DIAGNOSIS — N318 Other neuromuscular dysfunction of bladder: Secondary | ICD-10-CM

## 2011-02-18 DIAGNOSIS — K219 Gastro-esophageal reflux disease without esophagitis: Secondary | ICD-10-CM

## 2011-02-18 DIAGNOSIS — I10 Essential (primary) hypertension: Secondary | ICD-10-CM

## 2011-02-18 DIAGNOSIS — N3281 Overactive bladder: Secondary | ICD-10-CM

## 2011-02-18 DIAGNOSIS — Z23 Encounter for immunization: Secondary | ICD-10-CM

## 2011-02-18 DIAGNOSIS — I6990 Unspecified sequelae of unspecified cerebrovascular disease: Secondary | ICD-10-CM

## 2011-02-18 DIAGNOSIS — Z0000 Encounter for general adult medical examination without abnormal findings: Secondary | ICD-10-CM

## 2011-02-18 MED ORDER — OLMESARTAN-AMLODIPINE-HCTZ 40-10-25 MG PO TABS: 1.0000 | ORAL_TABLET | Freq: Every day | ORAL | Status: DC

## 2011-02-18 MED ORDER — TOLTERODINE TARTRATE ER 4 MG PO CP24: 4.0000 mg | ORAL_CAPSULE | Freq: Every day | ORAL | Status: DC

## 2011-02-18 NOTE — Progress Notes (Signed)
  Subjective:    Patient ID: Lori Fox, female    DOB: 1941/01/11, 71 y.o.   MRN: [id number]  HPI  Follow up for HTN, GERD and late effect of CVA Due the flu and the Td shot today   Review of Systems  Constitutional: Negative for activity change, appetite change and fatigue.  HENT: Negative for ear pain, congestion, neck pain, postnasal drip and sinus pressure.   Eyes: Negative for redness and visual disturbance.  Respiratory: Negative for cough, shortness of breath and wheezing.   Gastrointestinal: Negative for abdominal pain and abdominal distention.  Genitourinary: Negative for dysuria, frequency and menstrual problem.  Musculoskeletal: Negative for myalgias, joint swelling and arthralgias.  Skin: Negative for rash and wound.  Neurological: Negative for dizziness, weakness and headaches.  Hematological: Negative for adenopathy. Does not bruise/bleed easily.  Psychiatric/Behavioral: Negative for sleep disturbance and decreased concentration.       Objective:   Physical Exam  Constitutional: She is oriented to person, place, and time. She appears well-developed and well-nourished. No distress.  HENT:  Head: Normocephalic and atraumatic.  Right Ear: External ear normal.  Left Ear: External ear normal.  Nose: Nose normal.  Mouth/Throat: Oropharynx is clear and moist.  Eyes: Conjunctivae and EOM are normal. Pupils are equal, round, and reactive to light.  Neck: Normal range of motion. Neck supple. No JVD present. No tracheal deviation present. No thyromegaly present.  Cardiovascular: Normal rate, regular rhythm, normal heart sounds and intact distal pulses.   No murmur heard. Pulmonary/Chest: Effort normal and breath sounds normal. She has no wheezes. She exhibits no tenderness.  Abdominal: Soft. Bowel sounds are normal.  Musculoskeletal: Normal range of motion. She exhibits no edema and no tenderness.  Lymphadenopathy:    She has no cervical adenopathy.  Neurological:  She is alert and oriented to person, place, and time. She has normal reflexes. No cranial nerve deficit.  Skin: Skin is warm and dry. She is not diaphoretic.  Psychiatric: She has a normal mood and affect. Her behavior is normal.          Assessment & Plan:  She is stable standpoint of late affects of her CVA she walks with a brace on her leg and is ambulating on a regular basis she skipped her weight control and continues to be active.  Her hypertension is stable peripheral vascular disease is stable for treatment of her hyperlipidemia stable examples her medication is given blood pressure was initially elevated today though secondary to her being off medications we refilled her medications  History of overactive bladder she had been on vacation the past we'll give her a trial of Detrol LA as cost effective alternative to the medication she been on prior to this.

## 2011-02-18 NOTE — Patient Instructions (Signed)
The patient is instructed to continue all medications as prescribed. Schedule followup with check out clerk upon leaving the clinic  

## 2011-03-04 ENCOUNTER — Other Ambulatory Visit: Payer: Self-pay | Admitting: Internal Medicine

## 2011-03-09 ENCOUNTER — Other Ambulatory Visit: Payer: Self-pay | Admitting: Internal Medicine

## 2011-05-22 ENCOUNTER — Ambulatory Visit (INDEPENDENT_AMBULATORY_CARE_PROVIDER_SITE_OTHER): Payer: Medicare Other | Admitting: Internal Medicine

## 2011-05-22 ENCOUNTER — Ambulatory Visit: Payer: Medicare Other | Admitting: Internal Medicine

## 2011-05-22 VITALS — BP 170/80 | HR 76 | Temp 98.2°F | Resp 16 | Ht 65.0 in | Wt 144.0 lb

## 2011-05-22 DIAGNOSIS — N3281 Overactive bladder: Secondary | ICD-10-CM

## 2011-05-22 DIAGNOSIS — N318 Other neuromuscular dysfunction of bladder: Secondary | ICD-10-CM

## 2011-05-22 DIAGNOSIS — C44319 Basal cell carcinoma of skin of other parts of face: Secondary | ICD-10-CM

## 2011-05-22 DIAGNOSIS — I10 Essential (primary) hypertension: Secondary | ICD-10-CM

## 2011-05-22 MED ORDER — ROSUVASTATIN CALCIUM 20 MG PO TABS: 20.0000 mg | ORAL_TABLET | ORAL | Status: DC

## 2011-05-22 MED ORDER — TOLTERODINE TARTRATE ER 4 MG PO CP24: 4.0000 mg | ORAL_CAPSULE | Freq: Every day | ORAL | Status: DC

## 2011-05-22 MED ORDER — OLMESARTAN-AMLODIPINE-HCTZ 40-10-25 MG PO TABS: 1.0000 | ORAL_TABLET | Freq: Every morning | ORAL | Status: DC

## 2011-05-22 NOTE — Progress Notes (Signed)
  Subjective:    Patient ID: Lori Fox, female    DOB: 1940-05-03, 71 y.o.   MRN: [id number]  HPI Patient is a 71 year old female with a history of stroke who has hypertension hyperlipidemia peripheral vascular disease.  She also has contracture and hemiparesis secondary to a prior stroke.  She presents today with elevated blood pressure due to the fact that she did not take her blood pressure medications this morning.  We stressed the necessity of taking her medicines on a regular basis.  She also has a lesion on her abdomen which is a seborrheic keratosis and the lesion below her left eye that appears to be a basal cell carcinoma. This is an early lesion but we will try to treat with cryotherapy to the lesion recur she will need to see a dermatologist for excision.  She has a persistent frequent urination we will change the dosing of the Detrol to close the 6:00 to see if that will help her more during the nighttime hours   Review of Systems  Constitutional: Negative for activity change, appetite change and fatigue.  HENT: Negative for ear pain, congestion, neck pain, postnasal drip and sinus pressure.   Eyes: Negative for redness and visual disturbance.  Respiratory: Negative for cough, shortness of breath and wheezing.   Gastrointestinal: Negative for abdominal pain and abdominal distention.  Genitourinary: Positive for urgency and frequency. Negative for dysuria and menstrual problem.  Musculoskeletal: Positive for myalgias, joint swelling and gait problem. Negative for arthralgias.  Skin: Negative for rash and wound.  Neurological: Positive for speech difficulty and weakness. Negative for dizziness and headaches.       Gait disorder  Hematological: Negative for adenopathy. Does not bruise/bleed easily.  Psychiatric/Behavioral: Negative for disturbed wake/sleep cycle and decreased concentration.       Objective:   Physical Exam  Nursing note and vitals  reviewed. Constitutional: She appears well-developed and well-nourished.  HENT:  Head: Normocephalic and atraumatic.  Cardiovascular: Normal rate and regular rhythm.   Murmur heard. Pulmonary/Chest: Effort normal and breath sounds normal.  Abdominal: Soft. Bowel sounds are normal.          Assessment & Plan:  Patient's blood pressure is elevated but she did not take her medication today we urged her to take medication even when she comes in a fasting state before a possible blood work.  She has a lesion on her abdomen which may be a basal cell carcinoma it appears to be small less than half a centimeter in diameter and may be amenable to cryotherapy if the lesion does not completely disappear with cryotherapy of showing up to see a dermatologist for complete excision.  She notes increased frequency without dysuria we will titrate her date total as well as change the dosing to see if that will fact that time urinary frequency

## 2011-05-22 NOTE — Patient Instructions (Signed)
Never never never  Miss a pill Even when you are coming to this office be sure you take your medicines with a sip of water Missing medicines put you at risk for stroke

## 2011-06-05 ENCOUNTER — Telehealth: Payer: Self-pay | Admitting: Family Medicine

## 2011-06-05 NOTE — Telephone Encounter (Signed)
This should go to Dr. Lovell Sheehan

## 2011-06-05 NOTE — Telephone Encounter (Signed)
Refill request for Alprazolam 0.25 mg take 1 po bid prn and pt last here 05/22/11.

## 2011-06-08 ENCOUNTER — Other Ambulatory Visit: Payer: Self-pay | Admitting: *Deleted

## 2011-06-08 MED ORDER — ALPRAZOLAM 0.25 MG PO TABS: 0.2500 mg | ORAL_TABLET | Freq: Two times a day (BID) | ORAL | Status: DC

## 2011-06-08 NOTE — Telephone Encounter (Signed)
May refill 

## 2011-06-08 NOTE — Telephone Encounter (Signed)
done

## 2011-06-22 ENCOUNTER — Other Ambulatory Visit: Payer: Self-pay | Admitting: Internal Medicine

## 2011-06-27 ENCOUNTER — Other Ambulatory Visit: Payer: Self-pay | Admitting: Internal Medicine

## 2011-07-27 ENCOUNTER — Other Ambulatory Visit: Payer: Self-pay | Admitting: Internal Medicine

## 2011-07-29 ENCOUNTER — Telehealth: Payer: Self-pay | Admitting: Internal Medicine

## 2011-07-29 DIAGNOSIS — I10 Essential (primary) hypertension: Secondary | ICD-10-CM

## 2011-07-29 MED ORDER — OLMESARTAN-AMLODIPINE-HCTZ 40-10-25 MG PO TABS: 1.0000 | ORAL_TABLET | Freq: Every morning | ORAL | Status: DC

## 2011-07-29 NOTE — Telephone Encounter (Signed)
Pt requesting Prescription tribenzor. Pt was given sample and it currently out  [employer]

## 2011-07-29 NOTE — Telephone Encounter (Signed)
Sent in

## 2011-08-04 ENCOUNTER — Telehealth: Payer: Self-pay | Admitting: Internal Medicine

## 2011-08-04 DIAGNOSIS — I10 Essential (primary) hypertension: Secondary | ICD-10-CM

## 2011-08-04 MED ORDER — OLMESARTAN-AMLODIPINE-HCTZ 40-10-25 MG PO TABS: 1.0000 | ORAL_TABLET | Freq: Every morning | ORAL | Status: DC

## 2011-08-04 NOTE — Telephone Encounter (Signed)
Sent again

## 2011-08-04 NOTE — Telephone Encounter (Signed)
Pt called and said that they contacted [facility] in [place] re: Olmesartan-Amlodipine-HCTZ (TRIBENZOR) 40-10-25 MG TABS . Pharmacist told pt that med had not been sent in to pharmacy, so pls resend it asap. Pt is on last pill today so this needs to be called in directly to pharmacy asap today.

## 2011-08-21 ENCOUNTER — Ambulatory Visit: Payer: Medicare Other | Admitting: Internal Medicine

## 2011-08-26 ENCOUNTER — Ambulatory Visit: Payer: Medicare Other | Admitting: Internal Medicine

## 2011-08-27 ENCOUNTER — Other Ambulatory Visit: Payer: Self-pay | Admitting: Internal Medicine

## 2011-08-28 ENCOUNTER — Other Ambulatory Visit: Payer: Self-pay | Admitting: Internal Medicine

## 2011-09-30 ENCOUNTER — Encounter: Payer: Self-pay | Admitting: Internal Medicine

## 2011-09-30 ENCOUNTER — Ambulatory Visit (INDEPENDENT_AMBULATORY_CARE_PROVIDER_SITE_OTHER): Payer: Medicare Other | Admitting: Internal Medicine

## 2011-09-30 VITALS — BP 144/72 | HR 72 | Temp 98.2°F | Resp 18 | Ht 65.0 in | Wt 152.0 lb

## 2011-09-30 DIAGNOSIS — M720 Palmar fascial fibromatosis [Dupuytren]: Secondary | ICD-10-CM

## 2011-09-30 DIAGNOSIS — K219 Gastro-esophageal reflux disease without esophagitis: Secondary | ICD-10-CM

## 2011-09-30 DIAGNOSIS — I10 Essential (primary) hypertension: Secondary | ICD-10-CM

## 2011-09-30 NOTE — Progress Notes (Signed)
Subjective:    Patient ID: Lori Fox, female    DOB: Dec 17, 1940, 71 y.o.   MRN: [id number]  HPImonitoring for HTN, gate disorder and hx of PVD Stable     Review of Systems  Constitutional: Negative for activity change, appetite change and fatigue.  HENT: Negative for ear pain, congestion, neck pain, postnasal drip and sinus pressure.   Eyes: Negative for redness and visual disturbance.  Respiratory: Negative for cough, shortness of breath and wheezing.   Gastrointestinal: Negative for abdominal pain and abdominal distention.  Genitourinary: Negative for dysuria, frequency and menstrual problem.  Musculoskeletal: Negative for myalgias, joint swelling and arthralgias.  Skin: Negative for rash and wound.  Neurological: Negative for dizziness, weakness and headaches.  Hematological: Negative for adenopathy. Does not bruise/bleed easily.  Psychiatric/Behavioral: Negative for disturbed wake/sleep cycle and decreased concentration.   Past Medical History  Diagnosis Date  . Hyperlipidemia   . Stroke   . Hypertension   . Arthritis   . PVD (peripheral vascular disease)   . Blindness of left eye   . Allergy   . GERD (gastroesophageal reflux disease)     History   Social History  . Marital Status: Widowed    Spouse Name: N/A    Number of Children: N/A  . Years of Education: N/A   Occupational History  . Not on file.   Social History Main Topics  . Smoking status: Former Games developer  . Smokeless tobacco: Not on file  . Alcohol Use: No  . Drug Use: No  . Sexually Active: No   Other Topics Concern  . Not on file   Social History Narrative  . No narrative on file    Past Surgical History  Procedure Date  . Carotid endarterectomy   . Foot surgery rt metatarsal   . Rt knee arthroscopic     Family History  Problem Relation Age of Onset  . Cancer Father     head and neck  . Alzheimer's disease Mother     Allergies  Allergen Reactions  . Codeine Sulfate    REACTION: welts, hives    Current Outpatient Prescriptions on File Prior to Visit  Medication Sig Dispense Refill  . ALPRAZolam (XANAX) 0.25 MG tablet Take 1 tablet (0.25 mg total) by mouth 2 (two) times daily.  60 tablet  5  . carvedilol (COREG) 25 MG tablet TAKE 1 TABLET TWICE DAILY  60 tablet  3  . cloNIDine (CATAPRES) 0.2 MG tablet TAKE 1 TABLET THREE TIMES A DAY  90 tablet  11  . clopidogrel (PLAVIX) 75 MG tablet take 1 tablet by mouth once daily  30 tablet  11  . cyclobenzaprine (FLEXERIL) 10 MG tablet Take 10 mg by mouth at bedtime as needed.        Marland Kitchen escitalopram (LEXAPRO) 10 MG tablet TAKE 1 TABLET BY MOUTH ONCE DAILY  30 tablet  5  . esomeprazole (NEXIUM) 40 MG capsule Take 40 mg by mouth daily before breakfast.        . mupirocin (BACTROBAN) 2 % ointment Apply topically 3 (three) times daily.        . Olmesartan-Amlodipine-HCTZ (TRIBENZOR) 40-10-25 MG TABS Take 1 tablet by mouth every morning.  30 tablet  11  . rosuvastatin (CRESTOR) 20 MG tablet Take 1 tablet (20 mg total) by mouth once a week. At night      . tolterodine (DETROL LA) 4 MG 24 hr capsule Take 1 capsule (4 mg total) by mouth daily. At  6  PM      . zolpidem (AMBIEN) 10 MG tablet take 1 tablet by mouth at bedtime if needed for sleep  30 tablet  5    BP 144/72  Pulse 72  Temp 98.2 F (36.8 C)  Resp 18  Ht 5\' 5"  (1.651 m)  Wt 152 lb (68.947 kg)  BMI 25.29 kg/m2       Objective:   Physical Exam  Constitutional: She is oriented to person, place, and time. She appears well-developed and well-nourished. No distress.  HENT:  Head: Normocephalic and atraumatic.  Right Ear: External ear normal.  Left Ear: External ear normal.  Nose: Nose normal.  Mouth/Throat: Oropharynx is clear and moist.  Eyes: Conjunctivae and EOM are normal. Pupils are equal, round, and reactive to light.  Neck: Normal range of motion. Neck supple. No JVD present. No tracheal deviation present. No thyromegaly present.  Cardiovascular:  Normal rate, regular rhythm, normal heart sounds and intact distal pulses.   No murmur heard. Pulmonary/Chest: Effort normal and breath sounds normal. She has no wheezes. She exhibits no tenderness.  Abdominal: Soft. Bowel sounds are normal.  Musculoskeletal: Normal range of motion. She exhibits no edema and no tenderness.  Lymphadenopathy:    She has no cervical adenopathy.  Neurological: She is alert and oriented to person, place, and time. She has normal reflexes. No cranial nerve deficit.  Skin: Skin is warm and dry. She is not diaphoretic.  Psychiatric: She has a normal mood and affect. Her behavior is normal.          Assessment & Plan:  HTN stable GERD stable ROV 3 months

## 2011-09-30 NOTE — Patient Instructions (Addendum)
Soak the thumbnail and one half white vinegar and one half warm water for about 15 minutes a day

## 2011-10-07 ENCOUNTER — Telehealth: Payer: Self-pay | Admitting: Internal Medicine

## 2011-10-07 DIAGNOSIS — M79645 Pain in left finger(s): Secondary | ICD-10-CM

## 2011-10-07 NOTE — Telephone Encounter (Signed)
Per dr Lovell Sheehan- have pt get xray-pt informed and will have xray in am

## 2011-10-07 NOTE — Telephone Encounter (Signed)
Caller: Microbiologist; Patient Name: Lori Fox; PCP: Darryll Capers (Adults only); Best Callback Phone Number: [phone].  Called re Left thumb throbbing. Asking what else could be done.  Onset: 10/05/11.  Afebrile.  Severe pain described as "throbbing" rated 8/10; pain resolves with use of Tylenol.  Been soaking it in vinegar water X 15 mintes daily since 10/01/11.  Thumb was smashed in house window 1 yr ago.  Half of thumb nail is black.  Redness noted at base of nail. Advised to see MD within 4 hours for new signs and symptoms of localized infection that has not improved or worsening with 4 hours of home care per Hand Non-Injury guideline. All appointments are full for 10/07/11.  Information noted and sent to [id number] pool for immediate call back.

## 2011-10-08 ENCOUNTER — Ambulatory Visit (INDEPENDENT_AMBULATORY_CARE_PROVIDER_SITE_OTHER)
Admission: RE | Admit: 2011-10-08 | Discharge: 2011-10-08 | Disposition: A | Discharge status: Home / Self Care | Payer: Medicare Other | Source: Ambulatory Visit | Attending: Internal Medicine | Admitting: Internal Medicine

## 2011-10-08 DIAGNOSIS — M79645 Pain in left finger(s): Secondary | ICD-10-CM

## 2011-10-08 DIAGNOSIS — M79609 Pain in unspecified limb: Secondary | ICD-10-CM

## 2011-11-08 ENCOUNTER — Encounter: Payer: Self-pay | Admitting: Internal Medicine

## 2011-12-03 ENCOUNTER — Other Ambulatory Visit: Payer: Self-pay | Admitting: Internal Medicine

## 2011-12-18 ENCOUNTER — Other Ambulatory Visit: Payer: Self-pay | Admitting: Internal Medicine

## 2012-01-25 ENCOUNTER — Ambulatory Visit: Payer: Medicare Other | Admitting: Internal Medicine

## 2012-02-01 ENCOUNTER — Encounter (HOSPITAL_BASED_OUTPATIENT_CLINIC_OR_DEPARTMENT_OTHER): Payer: Self-pay | Admitting: *Deleted

## 2012-02-01 ENCOUNTER — Inpatient Hospital Stay (HOSPITAL_BASED_OUTPATIENT_CLINIC_OR_DEPARTMENT_OTHER)
Admission: EM | Admit: 2012-02-01 | Discharge: 2012-02-03 | DRG: 065 | Disposition: A | Discharge status: Home / Self Care | Payer: No Typology Code available for payment source | Attending: Internal Medicine | Admitting: Internal Medicine

## 2012-02-01 ENCOUNTER — Emergency Department (HOSPITAL_BASED_OUTPATIENT_CLINIC_OR_DEPARTMENT_OTHER): Payer: No Typology Code available for payment source

## 2012-02-01 DIAGNOSIS — M1710 Unilateral primary osteoarthritis, unspecified knee: Secondary | ICD-10-CM

## 2012-02-01 DIAGNOSIS — K219 Gastro-esophageal reflux disease without esophagitis: Secondary | ICD-10-CM

## 2012-02-01 DIAGNOSIS — I69959 Hemiplegia and hemiparesis following unspecified cerebrovascular disease affecting unspecified side: Secondary | ICD-10-CM

## 2012-02-01 DIAGNOSIS — M1990 Unspecified osteoarthritis, unspecified site: Secondary | ICD-10-CM

## 2012-02-01 DIAGNOSIS — B3749 Other urogenital candidiasis: Secondary | ICD-10-CM

## 2012-02-01 DIAGNOSIS — Z8612 Personal history of poliomyelitis: Secondary | ICD-10-CM

## 2012-02-01 DIAGNOSIS — I6990 Unspecified sequelae of unspecified cerebrovascular disease: Secondary | ICD-10-CM

## 2012-02-01 DIAGNOSIS — T887XXA Unspecified adverse effect of drug or medicament, initial encounter: Secondary | ICD-10-CM

## 2012-02-01 DIAGNOSIS — Z87891 Personal history of nicotine dependence: Secondary | ICD-10-CM

## 2012-02-01 DIAGNOSIS — L600 Ingrowing nail: Secondary | ICD-10-CM

## 2012-02-01 DIAGNOSIS — E785 Hyperlipidemia, unspecified: Secondary | ICD-10-CM

## 2012-02-01 DIAGNOSIS — Z9114 Patient's other noncompliance with medication regimen: Secondary | ICD-10-CM

## 2012-02-01 DIAGNOSIS — I639 Cerebral infarction, unspecified: Secondary | ICD-10-CM

## 2012-02-01 DIAGNOSIS — I739 Peripheral vascular disease, unspecified: Secondary | ICD-10-CM

## 2012-02-01 DIAGNOSIS — Z66 Do not resuscitate: Secondary | ICD-10-CM | POA: Diagnosis present

## 2012-02-01 DIAGNOSIS — G459 Transient cerebral ischemic attack, unspecified: Secondary | ICD-10-CM

## 2012-02-01 DIAGNOSIS — L03019 Cellulitis of unspecified finger: Secondary | ICD-10-CM

## 2012-02-01 DIAGNOSIS — I7389 Other specified peripheral vascular diseases: Secondary | ICD-10-CM | POA: Diagnosis present

## 2012-02-01 DIAGNOSIS — I6340 Cerebral infarction due to embolism of unspecified cerebral artery: Principal | ICD-10-CM | POA: Diagnosis present

## 2012-02-01 DIAGNOSIS — L732 Hidradenitis suppurativa: Secondary | ICD-10-CM

## 2012-02-01 DIAGNOSIS — J309 Allergic rhinitis, unspecified: Secondary | ICD-10-CM

## 2012-02-01 DIAGNOSIS — Z8679 Personal history of other diseases of the circulatory system: Secondary | ICD-10-CM

## 2012-02-01 DIAGNOSIS — Z91148 Patient's other noncompliance with medication regimen for other reason: Secondary | ICD-10-CM

## 2012-02-01 DIAGNOSIS — S0100XA Unspecified open wound of scalp, initial encounter: Secondary | ICD-10-CM

## 2012-02-01 DIAGNOSIS — M5412 Radiculopathy, cervical region: Secondary | ICD-10-CM

## 2012-02-01 DIAGNOSIS — I10 Essential (primary) hypertension: Secondary | ICD-10-CM

## 2012-02-01 DIAGNOSIS — N3281 Overactive bladder: Secondary | ICD-10-CM

## 2012-02-01 DIAGNOSIS — F411 Generalized anxiety disorder: Secondary | ICD-10-CM

## 2012-02-01 LAB — URINALYSIS, ROUTINE W REFLEX MICROSCOPIC
Bilirubin Urine: NEGATIVE
Ketones, ur: NEGATIVE mg/dL
Leukocytes, UA: NEGATIVE
Nitrite: NEGATIVE
Urobilinogen, UA: 1 mg/dL (ref 0.0–1.0)
pH: 5.5 (ref 5.0–8.0)

## 2012-02-01 LAB — CBC
MCHC: 34.6 g/dL (ref 30.0–36.0)
Platelets: 197 10*3/uL (ref 150–400)
RDW: 12.4 % (ref 11.5–15.5)
WBC: 5.7 10*3/uL (ref 4.0–10.5)

## 2012-02-01 LAB — PROTIME-INR
INR: 1.09 (ref 0.00–1.49)
Prothrombin Time: 14 seconds (ref 11.6–15.2)

## 2012-02-01 LAB — COMPREHENSIVE METABOLIC PANEL
ALT: 9 U/L (ref 0–35)
AST: 13 U/L (ref 0–37)
Albumin: 4.1 g/dL (ref 3.5–5.2)
CO2: 27 mEq/L (ref 19–32)
Calcium: 10.6 mg/dL — ABNORMAL HIGH (ref 8.4–10.5)
Chloride: 106 mEq/L (ref 96–112)
GFR calc non Af Amer: 30 mL/min — ABNORMAL LOW (ref >90)
Sodium: 142 mEq/L (ref 135–145)
Total Bilirubin: 0.3 mg/dL (ref 0.3–1.2)

## 2012-02-01 LAB — DIFFERENTIAL
Basophils Absolute: 0 10*3/uL (ref 0.0–0.1)
Basophils Relative: 0 % (ref 0–1)
Lymphocytes Relative: 21 % (ref 12–46)
Neutro Abs: 4.1 10*3/uL (ref 1.7–7.7)

## 2012-02-01 LAB — RAPID URINE DRUG SCREEN, HOSP PERFORMED
Barbiturates: NOT DETECTED
Benzodiazepines: NOT DETECTED
Tetrahydrocannabinol: NOT DETECTED

## 2012-02-01 LAB — APTT: aPTT: 46 seconds — ABNORMAL HIGH (ref 24–37)

## 2012-02-01 MED ORDER — LABETALOL HCL 5 MG/ML IV SOLN
20.0000 mg | Freq: Once | INTRAVENOUS | Status: AC
  Administered 2012-02-01: 20 mg via INTRAVENOUS
  Filled 2012-02-01: qty 4

## 2012-02-01 MED ORDER — CLONIDINE HCL 0.2 MG PO TABS
0.2000 mg | ORAL_TABLET | Freq: Three times a day (TID) | ORAL | Status: DC
  Administered 2012-02-02 – 2012-02-03 (×5): 0.2 mg via ORAL
  Filled 2012-02-01 (×7): qty 1

## 2012-02-01 MED ORDER — ZOLPIDEM TARTRATE 5 MG PO TABS
5.0000 mg | ORAL_TABLET | Freq: Every evening | ORAL | Status: DC | PRN
  Administered 2012-02-02: 5 mg via ORAL
  Filled 2012-02-01: qty 1

## 2012-02-01 MED ORDER — FESOTERODINE FUMARATE ER 8 MG PO TB24
8.0000 mg | ORAL_TABLET | Freq: Every day | ORAL | Status: DC
  Administered 2012-02-02 – 2012-02-03 (×2): 8 mg via ORAL
  Filled 2012-02-01 (×2): qty 1

## 2012-02-01 MED ORDER — CLONIDINE HCL 0.1 MG PO TABS
0.1000 mg | ORAL_TABLET | ORAL | Status: AC
  Administered 2012-02-01: 0.1 mg via ORAL
  Filled 2012-02-01: qty 1

## 2012-02-01 MED ORDER — CYCLOBENZAPRINE HCL 10 MG PO TABS: 10.0000 mg | ORAL_TABLET | Freq: Every evening | ORAL | Status: DC | PRN

## 2012-02-01 MED ORDER — CLONIDINE HCL 0.1 MG PO TABS
0.2000 mg | ORAL_TABLET | Freq: Once | ORAL | Status: AC
  Administered 2012-02-01: 0.2 mg via ORAL
  Filled 2012-02-01: qty 1
  Filled 2012-02-01: qty 2

## 2012-02-01 MED ORDER — CLOPIDOGREL BISULFATE 75 MG PO TABS
75.0000 mg | ORAL_TABLET | Freq: Every day | ORAL | Status: DC
  Administered 2012-02-02 – 2012-02-03 (×2): 75 mg via ORAL
  Filled 2012-02-01 (×3): qty 1

## 2012-02-01 MED ORDER — ATORVASTATIN CALCIUM 40 MG PO TABS
40.0000 mg | ORAL_TABLET | Freq: Every day | ORAL | Status: DC
  Administered 2012-02-02: 40 mg via ORAL
  Filled 2012-02-01 (×2): qty 1

## 2012-02-01 MED ORDER — HYDRALAZINE HCL 20 MG/ML IJ SOLN
5.0000 mg | INTRAMUSCULAR | Status: DC | PRN
  Filled 2012-02-01: qty 0.5

## 2012-02-01 MED ORDER — ALPRAZOLAM 0.25 MG PO TABS
0.2500 mg | ORAL_TABLET | Freq: Two times a day (BID) | ORAL | Status: DC
  Administered 2012-02-02 – 2012-02-03 (×4): 0.25 mg via ORAL
  Filled 2012-02-01 (×4): qty 1

## 2012-02-01 MED ORDER — PANTOPRAZOLE SODIUM 40 MG PO TBEC
80.0000 mg | DELAYED_RELEASE_TABLET | Freq: Every day | ORAL | Status: DC
  Administered 2012-02-02 – 2012-02-03 (×2): 80 mg via ORAL
  Filled 2012-02-01 (×2): qty 1

## 2012-02-01 MED ORDER — ESCITALOPRAM OXALATE 10 MG PO TABS
10.0000 mg | ORAL_TABLET | Freq: Every day | ORAL | Status: DC
  Administered 2012-02-02 – 2012-02-03 (×2): 10 mg via ORAL
  Filled 2012-02-01 (×2): qty 1

## 2012-02-01 NOTE — ED Provider Notes (Addendum)
History     CSN: [id number]  Arrival date & time 02/01/12  1249   First MD Initiated Contact with Patient 02/01/12 1439      Chief Complaint  Patient presents with  . Neck problem   . Spasms    (Consider location/radiation/quality/duration/timing/severity/associated sxs/prior treatment) HPI  Patient states slurred speech on awakening at 1100 a.m.  She states she felt "staggery"  Housemate noted slurred speech and difficulty getting up out of bed.  States normal now.  Patient last known normal at 1230 a.m.  Symtpoms have now resolved and right side weakness at baseline.    Past Medical History  Diagnosis Date  . Hyperlipidemia   . Stroke   . Hypertension   . Arthritis   . PVD (peripheral vascular disease)   . Blindness of left eye   . Allergy   . GERD (gastroesophageal reflux disease)     Past Surgical History  Procedure Date  . Carotid endarterectomy   . Foot surgery rt metatarsal   . Rt knee arthroscopic     Family History  Problem Relation Age of Onset  . Cancer Father     head and neck  . Alzheimer's disease Mother     History  Substance Use Topics  . Smoking status: Former Games developer  . Smokeless tobacco: Not on file  . Alcohol Use: No    OB History    Grav Para Term Preterm Abortions TAB SAB Ect Mult Living                  Review of Systems  Constitutional: Negative for fever, chills, activity change, appetite change and unexpected weight change.  HENT: Negative for sore throat, rhinorrhea, neck pain, neck stiffness and sinus pressure.   Eyes: Negative for visual disturbance.  Respiratory: Negative for cough and shortness of breath.   Cardiovascular: Negative for chest pain and leg swelling.  Gastrointestinal: Negative for vomiting, abdominal pain, diarrhea and blood in stool.  Genitourinary: Negative for dysuria, urgency, frequency, vaginal discharge and difficulty urinating.  Musculoskeletal: Negative for myalgias, arthralgias and gait problem.   Skin: Negative for color change and rash.  Neurological: Negative for weakness, light-headedness and headaches.  Hematological: Does not bruise/bleed easily.  Psychiatric/Behavioral: Negative for dysphoric mood.    Allergies  Codeine sulfate  Home Medications   Current Outpatient Rx  Name  Route  Sig  Dispense  Refill  . ALPRAZOLAM 0.25 MG PO TABS      take 1 tablet by mouth twice a day   60 tablet   5   . CARVEDILOL 25 MG PO TABS      TAKE 1 TABLET TWICE DAILY   60 tablet   3   . CLONIDINE HCL 0.2 MG PO TABS      TAKE 1 TABLET THREE TIMES A DAY   90 tablet   11   . CLOPIDOGREL BISULFATE 75 MG PO TABS      take 1 tablet by mouth once daily   30 tablet   11   . CYCLOBENZAPRINE HCL 10 MG PO TABS   Oral   Take 10 mg by mouth at bedtime as needed.           Marland Kitchen ESCITALOPRAM OXALATE 10 MG PO TABS      TAKE 1 TABLET BY MOUTH ONCE DAILY   30 tablet   5     Refill Approved   . ESOMEPRAZOLE MAGNESIUM 40 MG PO CPDR   Oral  Take 40 mg by mouth daily before breakfast.           . MUPIROCIN 2 % EX OINT   Topical   Apply topically 3 (three) times daily.           Marland Kitchen OLMESARTAN-AMLODIPINE-HCTZ 40-10-25 MG PO TABS   Oral   Take 1 tablet by mouth every morning.   30 tablet   11   . ROSUVASTATIN CALCIUM 20 MG PO TABS   Oral   Take 1 tablet (20 mg total) by mouth once a week. At night         . TOLTERODINE TARTRATE ER 4 MG PO CP24   Oral   Take 1 capsule (4 mg total) by mouth daily. At  6  PM         . ZOLPIDEM TARTRATE 10 MG PO TABS      take 1 tablet by mouth at bedtime if needed for sleep   30 tablet   5     BP 194/61  Pulse 66  Temp 97.4 F (36.3 C) (Oral)  Resp 18  SpO2 100%  Physical Exam  Nursing note and vitals reviewed. Constitutional: She is oriented to person, place, and time. She appears well-developed and well-nourished.  HENT:  Head: Normocephalic and atraumatic.  Right Ear: External ear normal.  Left Ear: External ear  normal.  Eyes: Conjunctivae normal are normal.       eom intact, left eye with congenital vision defect, pupil not reactive  Neck: Normal range of motion. Neck supple.  Cardiovascular: Normal rate, regular rhythm, normal heart sounds and intact distal pulses.   Pulmonary/Chest: Effort normal and breath sounds normal.  Abdominal: Soft.  Musculoskeletal:       Fransisca Connors unable to move shoulder against gravity, right elbow no movement but some cogwheeling when ranged, right wrist or finger,  Right hip with good extension and full arom right knee, right ankle in brace  Neurological: She is alert and oriented to person, place, and time.  Reflex Scores:      Tricep reflexes are 3+ on the left side.      Bicep reflexes are 4+ on the left side.      Brachioradialis reflexes are 3+ on the left side.      Patellar reflexes are 2+ on the right side and 2+ on the left side. Skin: Skin is warm and dry.  Psychiatric: She has a normal mood and affect.    ED Course  Procedures (including critical care time)  Labs Reviewed - No data to display No results found.   No diagnosis found.   Date: 02/01/2012  Rate: 65  Rhythm: normal sinus rhythm  QRS Axis: left  Intervals: normal  ST/T Wave abnormalities: nonspecific ST changes  Conduction Disutrbances:left anterior fascicular block  Narrative Interpretation:   Old EKG Reviewed: changes noted    MDM  Patient with symptoms resolved prior to my evaluation.  Patient with last known normal greater than 15 hours pte.  Patient did not take bp meds today.  CT without acute abnormality.  Discussed with Dr. Radonna Ricker and patient to be transferred to tele bed. Labetalol iv given and patient's clonidine dosed orally.     CRITICAL CARE Performed by: Hilario Quarry   Total critical care time: 60  Critical care time was exclusive of separately billable procedures and treating other patients.  Critical care was necessary to treat or prevent imminent or  life-threatening deterioration.  Critical care was time spent  personally by me on the following activities: development of treatment plan with patient and/or surrogate as well as nursing, discussions with consultants, evaluation of patient's response to treatment, examination of patient, obtaining history from patient or surrogate, ordering and performing treatments and interventions, ordering and review of laboratory studies, ordering and review of radiographic studies, pulse oximetry and re-evaluation of patient's condition.     Hilario Quarry, MD 02/01/12 1626  Patient pending transport-care discussed with Dr. Rulon Abide and he assumes care in interim to transport.   Hilario Quarry, MD 02/01/12 [phone]

## 2012-02-01 NOTE — ED Notes (Signed)
UA specimen in lab.

## 2012-02-01 NOTE — ED Notes (Signed)
[facility] at bedside preparing for transport 

## 2012-02-01 NOTE — ED Notes (Signed)
Report called to Emily, RN [facility]. 

## 2012-02-01 NOTE — ED Notes (Signed)
EMTALA consent obtained from pt. 

## 2012-02-01 NOTE — ED Notes (Signed)
Report received from Joss, RN, care assumed 

## 2012-02-01 NOTE — ED Notes (Signed)
Per EMS:  Pt woke up this am with a Popping sensation in her left side of neck.  Pt states she had some muscle tremors at that time in her left arm.  Muscles tremors ceased before EMS arrived.  Pt wants to be checked out.  VSS

## 2012-02-01 NOTE — ED Notes (Signed)
Room assignment received.  Awaiting [facility]. Pt and family informed of plan of care.

## 2012-02-01 NOTE — ED Notes (Signed)
Patient transported to CT 

## 2012-02-02 ENCOUNTER — Inpatient Hospital Stay (HOSPITAL_COMMUNITY): Payer: No Typology Code available for payment source

## 2012-02-02 DIAGNOSIS — G459 Transient cerebral ischemic attack, unspecified: Secondary | ICD-10-CM

## 2012-02-02 DIAGNOSIS — I639 Cerebral infarction, unspecified: Secondary | ICD-10-CM | POA: Diagnosis present

## 2012-02-02 DIAGNOSIS — I10 Essential (primary) hypertension: Secondary | ICD-10-CM

## 2012-02-02 LAB — CBC
HCT: 38.4 % (ref 36.0–46.0)
MCH: 33 pg (ref 26.0–34.0)
MCV: 93.2 fL (ref 78.0–100.0)
Platelets: 190 10*3/uL (ref 150–400)
RDW: 12.7 % (ref 11.5–15.5)
WBC: 7.6 10*3/uL (ref 4.0–10.5)

## 2012-02-02 LAB — LIPID PANEL
HDL: 36 mg/dL — ABNORMAL LOW (ref >39)
LDL Cholesterol: 86 mg/dL (ref 0–99)
Total CHOL/HDL Ratio: 4.1 RATIO
Triglycerides: 131 mg/dL (ref <150)

## 2012-02-02 LAB — CREATININE, SERUM: GFR calc Af Amer: 44 mL/min — ABNORMAL LOW (ref >90)

## 2012-02-02 LAB — HEMOGLOBIN A1C
Hgb A1c MFr Bld: 6 % — ABNORMAL HIGH (ref <5.7)
Mean Plasma Glucose: 126 mg/dL — ABNORMAL HIGH (ref <117)

## 2012-02-02 MED ORDER — CARVEDILOL 25 MG PO TABS
25.0000 mg | ORAL_TABLET | Freq: Two times a day (BID) | ORAL | Status: DC
  Administered 2012-02-02 – 2012-02-03 (×3): 25 mg via ORAL
  Filled 2012-02-02 (×5): qty 1

## 2012-02-02 MED ORDER — AMLODIPINE BESYLATE 10 MG PO TABS
10.0000 mg | ORAL_TABLET | Freq: Every day | ORAL | Status: DC
  Administered 2012-02-02 – 2012-02-03 (×2): 10 mg via ORAL
  Filled 2012-02-02 (×2): qty 1

## 2012-02-02 MED ORDER — HEPARIN SODIUM (PORCINE) 5000 UNIT/ML IJ SOLN
5000.0000 [IU] | Freq: Three times a day (TID) | INTRAMUSCULAR | Status: DC
  Administered 2012-02-02 – 2012-02-03 (×4): 5000 [IU] via SUBCUTANEOUS
  Filled 2012-02-02 (×7): qty 1

## 2012-02-02 NOTE — H&P (Signed)
[facility] History and Physical  Lori Fox GNF:[id number] DOB: 11/06/1940 DOA: 02/01/2012  Referring physician: ED PCP: Lori Mew, MD  Specialists: Neurology  Chief Complaint: TIA  HPI: Lori Fox is a 71 y.o. female who presents with c/o headache, slurred speech and difficulty getting up out of bed onset on awaking at 11 AM this morning, questionable new L sided weakness as well.  Thankfully her symptoms rapidly resolved after arrival to the ED at an outpatient facility.  Unfortunately Ms. 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  Review of Systems: 12 systems reviewed and negative.  Past Medical History  Diagnosis Date  . Hyperlipidemia   . Stroke   . Hypertension   . Arthritis   . PVD (peripheral vascular disease)   . Blindness of left eye   . Allergy   . GERD (gastroesophageal reflux disease)    Past Surgical History  Procedure Date  . Carotid endarterectomy   . Foot surgery rt metatarsal   . Rt knee arthroscopic    Social History:  reports that she has quit smoking. She does not have any smokeless tobacco history on file. She reports that she does not drink alcohol or use illicit drugs.   Allergies  Allergen Reactions  . Codeine Sulfate     REACTION: welts, hives    Family History  Problem Relation Age of Onset  . Cancer Father     head and neck  . Alzheimer's disease Mother     Prior to Admission medications   Medication Sig Start Date End Date Taking? Authorizing Provider  ALPRAZolam Prudy Feeler) 0.25 MG tablet take 1 tablet by mouth twice a day 12/18/11  Yes Stacie Glaze, MD  carvedilol (COREG) 25 MG tablet TAKE 1 TABLET TWICE DAILY 12/03/11  Yes Stacie Glaze, MD  cloNIDine (CATAPRES) 0.2 MG tablet TAKE 1 TABLET THREE TIMES A DAY 06/27/11  Yes Stacie Glaze, MD  clopidogrel (PLAVIX) 75 MG tablet Take 75 mg by mouth daily.   Yes  Historical Provider, MD  cyclobenzaprine (FLEXERIL) 10 MG tablet Take 10 mg by mouth at bedtime as needed. Muscle spasms   Yes Historical Provider, MD  escitalopram (LEXAPRO) 10 MG tablet TAKE 1 TABLET BY MOUTH ONCE DAILY 08/28/11  Yes Stacie Glaze, MD  esomeprazole (NEXIUM) 40 MG capsule Take 40 mg by mouth daily as needed. Acid reflux   Yes Historical Provider, MD  Olmesartan-Amlodipine-HCTZ Marya Landry) 40-10-25 MG TABS Take 1 tablet by mouth every morning. 08/04/11  Yes Stacie Glaze, MD  rosuvastatin (CRESTOR) 20 MG tablet Take 20 mg by mouth once a week. At night on fridays 05/22/11  Yes Stacie Glaze, MD  tolterodine (DETROL LA) 4 MG 24 hr capsule Take 1 capsule (4 mg total) by mouth daily. At  6  PM 05/22/11 05/21/12 Yes Stacie Glaze, MD  zolpidem [facility]) 10 MG tablet take 1 tablet by mouth at bedtime if needed for sleep 08/27/11  Yes Stacie Glaze, MD   Physical Exam: Filed Vitals:   02/01/12 1930 02/01/12 1933 02/01/12 2130 02/01/12 2330  BP: 222/69 227/79 225/65 183/67  Pulse: 65  72 74  Temp: 97.9 F (36.6 C)  98.1 F (36.7 C) 98.4 F (36.9 C)  TempSrc:      Resp: 20  20 20   SpO2: 99%  98% 96%    General:  NAD, resting comfortably in bed Eyes: R eye reactive, L eye pupil is constricted, non reactive, blind, and has lateral deviation (all of this the patient states is baseline and has been so since birth) ENT: mucous membranes moist Neck: supple w/o JVD Cardiovascular: RRR w/o MRG Respiratory: CTA B Abdomen: soft, nt, nd, bs+ Skin: no rash nor lesion Musculoskeletal: MAE, full ROM all 4 extremities Psychiatric: normal tone and affect Neurologic: AAOx3, Left handed patient, no speech deficit, patient has R sided hemiparesis there seems to be some R sided hemiplegia present as well, other marked deficits on the R side noted, she states that this is her baseline since the stroke 15 years ago and specifically denies that any of these R sided issues are new today, L eye  abnormalities are again noted, these are baseline since birth the patient states.  She feels that her L sided strength has returned to baseline and notes no speech deficit at this time.  Labs on Admission:  Basic Metabolic Panel:  Lab 02/01/12 1610  NA 142  K 4.4  CL 106  CO2 27  GLUCOSE 91  BUN 29*  CREATININE 1.70*  CALCIUM 10.6*  MG --  PHOS --   Liver Function Tests:  Lab 02/01/12 1505  AST 13  ALT 9  ALKPHOS 156*  BILITOT 0.3  PROT 7.7  ALBUMIN 4.1   No results found for this basename: LIPASE:5,AMYLASE:5 in the last 168 hours No results found for this basename: AMMONIA:5 in the last 168 hours CBC:  Lab 02/01/12 1505  WBC 5.7  NEUTROABS 4.1  HGB 13.6  HCT 39.3  MCV 95.6  PLT 197   Cardiac Enzymes: No results found for this basename: CKTOTAL:5,CKMB:5,CKMBINDEX:5,TROPONINI:5 in the last 168 hours  BNP (last 3 results) No results found for this basename: PROBNP:3 in the last 8760 hours CBG:  Lab 02/01/12 1506  GLUCAP 88    Radiological Exams on Admission: Ct Head Wo Contrast  02/01/2012  *RADIOLOGY REPORT*  Clinical Data: Spasms  CT HEAD WITHOUT CONTRAST  Technique:  Contiguous axial images were obtained from the base of the skull through the vertex without contrast.  Comparison: CT 09/22/2008  Findings: Chronic left MCA infarct is unchanged.  There is volume loss in the left with dilatation of the left lateral ventricle.  No acute infarct.  Negative for hemorrhage or mass lesion.  Mild chronic microvascular ischemia in the cerebral white matter on the right.  Negative for hemorrhage or mass.  Calvarium is intact.  IMPRESSION: Chronic left MCA infarct.  No acute abnormality.   Original Report Authenticated By: Janeece Riggers, M.D.     EKG: Independently reviewed.  Assessment/Plan Principal Problem:  *TIA (transient ischemic attack) Active Problems:  HYPERTENSION, MALIGNANT ESSENTIAL  CEREBROVASCULAR ACCIDENT, HX OF   1. TIA - admitting patient for TIA  work up, BP control with SBP goal 180 or less given symptomatic headache and very elevated BPs today this per neurology consult.  Have put in to continue home clonidine and beta blocker, will hold off on the tribenzor for the moment in favor of short acting hydralazine to achieve BP goals.  EEG ordered, continuing plavix, MRI/MRA ordered, carotid dopplers and 2d echo ordered.  Given clinical picture suspicious that patient may be R hemisphere dominate. 2. HTN - BP control as above 3. Dyslipidemia - continue statin.  Neurology consulted and seeing patient in consult.  Code Status: DNI/DNR (must indicate code status--if unknown or must be presumed, indicate so) Family Communication: No  family in room (indicate person spoken with, if applicable, with phone number if by telephone) Disposition Plan: Admit to inpatient (indicate anticipated LOS)  Time spent: 70 min  GARDNER, JARED M. [facility] Pager [phone]  If 7PM-7AM, please contact night-coverage [email or web address] Password [facility] 02/02/2012, 12:18 AM

## 2012-02-02 NOTE — Progress Notes (Signed)
VASCULAR LAB PRELIMINARY  PRELIMINARY  PRELIMINARY  PRELIMINARY  Carotid duplex  completed.    Preliminary report:  Right:  No evidence of hemodynamically significant internal carotid artery stenosis.  CEA patent.  Left:  Occluded internal carotid artery vs trickle flow.  Bilateral:  Vertebral artery flow is antegrade.     Talani Brazee, RVT 02/02/2012, 4:31 PM

## 2012-02-02 NOTE — Progress Notes (Signed)
Routine EEG completed.  

## 2012-02-02 NOTE — Consult Note (Signed)
Referring Physician: Dr. Julian Reil    Chief Complaint: Transient slurred speech and right hand weakness and twitching.  HPI: Lori Fox is an 71 y.o. female history of left MCA stroke and right hemiparesis, hyperlipidemia, hypertension and right carotid endarterectomy, presenting with history of transient twitching and weakness of the left hand this morning followed by slurred speech. Patient has been on Plavix 75 mg per day. Deficits resolved prior to arriving in the emergency room. CT scan of her head showed no acute intracranial abnormality. Blood pressure was noted to be elevated at 233/77. She was given IV hydralazine. Most recent blood pressure was 183/67.  LSN: 8 AM on 02/01/2012 tPA Given: No: Deficits rapidly resolved MRankin: 1  Past Medical History  Diagnosis Date  . Hyperlipidemia   . Stroke   . Hypertension   . Arthritis   . PVD (peripheral vascular disease)   . Blindness of left eye   . Allergy   . GERD (gastroesophageal reflux disease)     Family History  Problem Relation Age of Onset  . Cancer Father     head and neck  . Alzheimer's disease Mother      Medications:  Prior to Admission:  Prescriptions prior to admission  Medication Sig Dispense Refill  . ALPRAZolam (XANAX) 0.25 MG tablet take 1 tablet by mouth twice a day  60 tablet  5  . carvedilol (COREG) 25 MG tablet TAKE 1 TABLET TWICE DAILY  60 tablet  3  . cloNIDine (CATAPRES) 0.2 MG tablet TAKE 1 TABLET THREE TIMES A DAY  90 tablet  11  . clopidogrel (PLAVIX) 75 MG tablet Take 75 mg by mouth daily.      . cyclobenzaprine (FLEXERIL) 10 MG tablet Take 10 mg by mouth at bedtime as needed. Muscle spasms      . escitalopram (LEXAPRO) 10 MG tablet TAKE 1 TABLET BY MOUTH ONCE DAILY  30 tablet  5  . esomeprazole (NEXIUM) 40 MG capsule Take 40 mg by mouth daily as needed. Acid reflux      . Olmesartan-Amlodipine-HCTZ (TRIBENZOR) 40-10-25 MG TABS Take 1 tablet by mouth every morning.  30 tablet  11  .  rosuvastatin (CRESTOR) 20 MG tablet Take 20 mg by mouth once a week. At night on fridays      . tolterodine (DETROL LA) 4 MG 24 hr capsule Take 1 capsule (4 mg total) by mouth daily. At  6  PM      . zolpidem (AMBIEN) 10 MG tablet take 1 tablet by mouth at bedtime if needed for sleep  30 tablet  5  . [DISCONTINUED] rosuvastatin (CRESTOR) 20 MG tablet Take 1 tablet (20 mg total) by mouth once a week. At night         Physical Examination: Blood pressure 183/67, pulse 74, temperature 98.4 F (36.9 C), temperature source Oral, resp. rate 20, SpO2 96.00%.  Neurologic Examination: Mental Status: Alert, oriented, thought content appropriate.  Speech fluent without evidence of aphasia. Able to follow commands without difficulty. Cranial Nerves: II-Visual fields were normal via right eye; no vision in left eye. III/IV/VI-right pupil reacted normally to light; no reaction on the left. Left exotropia; left eye moves to the midline on right gaze; full right and left gaze of right eye.    V/VII-no facial numbness and no facial weakness. VIII-normal. X-normal speech and symmetrical palatal movement. 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 Normal strength proximally and distally of left upper and lower extremities. Sensory: Normal throughout. Deep Tendon Reflexes: 2+ and brisk and slightly greater on the right compared to left. Plantars: Flexor on the left. Cerebellar: Normal finger-to-nose testing with use of left upper extremity. Carotid auscultation: Bilateral carotid bruits, right greater than left.  Ct Head Wo Contrast  02/01/2012  *RADIOLOGY REPORT*  Clinical Data: Spasms  CT HEAD WITHOUT CONTRAST  Technique:  Contiguous axial images were obtained from the base of  the skull through the vertex without contrast.  Comparison: CT 09/22/2008  Findings: Chronic left MCA infarct is unchanged.  There is volume loss in the left with dilatation of the left lateral ventricle.  No acute infarct.  Negative for hemorrhage or mass lesion.  Mild chronic microvascular ischemia in the cerebral white matter on the right.  Negative for hemorrhage or mass.  Calvarium is intact.  IMPRESSION: Chronic left MCA infarct.  No acute abnormality.   Original Report Authenticated By: Janeece Riggers, M.D.     Assessment: 71 y.o. female with previous left MCA stroke and residual right hemiparesis presenting with transient slurred speech and left hand weakness and twitching. Right subcortical TIA or small vessel ischemic infarction cannot be ruled out. Focal seizure is less likely but cannot be completely ruled out as well.  Stroke Risk Factors - hyperlipidemia and hypertension  Plan: 1. HgbA1c, fasting lipid panel 2. MRI, MRA  of the brain without contrast 3. PT consult, OT consult, Speech consult 4. Echocardiogram 5. Carotid dopplers 6. Prophylactic therapy-Antiplatelet med: Plavix 75 mg per day 7. EEG, routine, to rule out indications of new-onset focal seizure disorder   C.R. Roseanne Reno, MD [facility] [phone]  02/02/2012, 12:23 AM

## 2012-02-02 NOTE — Progress Notes (Signed)
PT Cancellation/Discharge Note  Patient Details Name: Lori Fox MRN: [id number] DOB: Mar 05, 1940   Cancelled Treatment:    Reason Eval Not Completed: See OT eval (pt at baseline). On arrival, pt up walking in room independently without device or AFOs. She and her friend report that she is at her baseline. Pt walked out into hallway, made several turns, denied dizziness, and returned to her room. Eager to go home today.  Patient is being discharged from PT services secondary to:  Patient is at baseline; pt/friend agree no need for further therapy.   Discharge plan discussed with patient/caregiver and they  Agree    Lot Medford 02/02/2012, 3:45 PM Pager [phone]

## 2012-02-02 NOTE — Progress Notes (Signed)
Stroke Team Progress Note  HISTORY Lori Fox is an 71 y.o. female history of left MCA stroke and right hemiparesis, hyperlipidemia, hypertension and right carotid endarterectomy, presenting with history of transient twitching and weakness of the left hand this morning 02/02/2012 followed by slurred speech. Patient has been on Plavix 75 mg per day. Deficits resolved prior to arriving in the emergency room. CT scan of her head showed no acute intracranial abnormality. Blood pressure was noted to be elevated at 233/77. She was given IV hydralazine. Most recent blood pressure was 183/67.Patient was not a TPA candidate secondary to rapidly resolving deficits. She was admitted for further evaluation and treatment.  SUBJECTIVE Her husband (?) is at the bedside.  Overall she feels her condition is back to baseline. "My left ear popped yesterday, really hard, it hurt, but is all better today." Patient wants to go home, adamantly wants to go home.  OBJECTIVE Most recent Vital Signs: Filed Vitals:   02/02/12 0130 02/02/12 0330 02/02/12 0530 02/02/12 0804  BP: 160/47 131/55 155/46 201/58  Pulse: 64 61 58 69  Temp: 98.3 F (36.8 C) 98.4 F (36.9 C) 97.8 F (36.6 C) 98.6 F (37 C)  TempSrc:    Oral  Resp: 20 20 20 20   SpO2: 96% 98% 97% 95%   CBG (last 3)   Basename 02/01/12 1506  GLUCAP 88    IV Fluid Intake:     MEDICATIONS    . ALPRAZolam  0.25 mg Oral BID  . atorvastatin  40 mg Oral q1800  . carvedilol  25 mg Oral BID WC  . cloNIDine  0.2 mg Oral TID  . clopidogrel  75 mg Oral Q breakfast  . escitalopram  10 mg Oral Daily  . fesoterodine  8 mg Oral Daily  . heparin  5,000 Units Subcutaneous Q8H  . pantoprazole  80 mg Oral Q1200   PRN:  cyclobenzaprine, hydrALAZINE, zolpidem  Diet:  Cardiac thin liquids Activity:  OOB with assistance DVT Prophylaxis:  Heparin 5000 units sq tid  CLINICALLY SIGNIFICANT STUDIES Basic Metabolic Panel:  Lab 02/01/12 4403  NA 142  K 4.4  CL 106   CO2 27  GLUCOSE 91  BUN 29*  CREATININE 1.70*  CALCIUM 10.6*  MG --  PHOS --   Liver Function Tests:  Lab 02/01/12 1505  AST 13  ALT 9  ALKPHOS 156*  BILITOT 0.3  PROT 7.7  ALBUMIN 4.1   CBC:  Lab 02/01/12 1505  WBC 5.7  NEUTROABS 4.1  HGB 13.6  HCT 39.3  MCV 95.6  PLT 197   Coagulation:  Lab 02/01/12 1505  LABPROT 14.0  INR 1.09   Cardiac Enzymes: No results found for this basename: CKTOTAL:3,CKMB:3,CKMBINDEX:3,TROPONINI:3 in the last 168 hours Urinalysis:  Lab 02/01/12 1500  COLORURINE YELLOW  LABSPEC 1.009  PHURINE 5.5  GLUCOSEU NEGATIVE  HGBUR NEGATIVE  BILIRUBINUR NEGATIVE  KETONESUR NEGATIVE  PROTEINUR NEGATIVE  UROBILINOGEN 1.0  NITRITE NEGATIVE  LEUKOCYTESUR NEGATIVE   Lipid Panel    Component Value Date/Time   CHOL 204* 04/23/2009 1408   TRIG 121 06/17/2006 1251   HDL 38.70* 04/23/2009 1408   CHOLHDL 4.5 CALC 06/17/2006 1251   VLDL 24 06/17/2006 1251   LDLCALC 74 06/17/2006 1251   HgbA1C  Lab Results  Component Value Date   HGBA1C 6.2* 06/17/2006    Urine Drug Screen:     Component Value Date/Time   LABOPIA NONE DETECTED 02/01/2012 1454   COCAINSCRNUR NONE DETECTED 02/01/2012 1454  LABBENZ NONE DETECTED 02/01/2012 1454   AMPHETMU NONE DETECTED 02/01/2012 1454   THCU NONE DETECTED 02/01/2012 1454   LABBARB NONE DETECTED 02/01/2012 1454    Alcohol Level: No results found for this basename: ETH:2 in the last 168 hours  CT of the brain  02/01/2012  Chronic left MCA infarct.  No acute abnormality.  MRI of the brain    MRA of the brain    2D Echocardiogram    Carotid Doppler    CXR    EKG  normal sinus rhythm.   Therapy Recommendations pending  Physical Exam   GENERAL EXAM: Patient is in no distress  CARDIOVASCULAR: Regular rate and rhythm, no murmurs, no carotid bruits  NEUROLOGIC: MENTAL STATUS: awake, alert, language fluent, comprehension intact, naming intact CRANIAL NERVE: RIGHT PUPIL REACTS. LEFT PUPIL NO REACTION. LEFT  EXOTROPIA. NO VISON FROM LEFT EYE. RIGHT EYE visual fields full to confrontation, extraocular muscles intact, no nystagmus, facial sensation and strength symmetric, uvula midline, shoulder shrug symmetric, tongue midline. MOTOR: RUE (1-2/5 PROX, 0/5 DISTAL) AND RLE (1-2 PROX). RIGHT FOOT AMPUTATION. LUE AND LLE FULL STRENGTH. SENSORY: normal and symmetric to light touch, pinprick COORDINATION: LIMITED IN RIGHT SIDE DUE TO WEAKNESS. NO ATAXIA IN LUE LLE.  REFLEXES: BRISK IN RUE AND RLE.  ASSESSMENT Ms. MAEVYN RIORDAN is a 71 y.o. left handed female presenting with transient twitching, left hand weakness followed by slurred speech in setting of left brain stroke 13 years ago and accelerated hypertension. Suspect right brain stroke vs TIA. Imaging pending. Work up underway. On clopidogrel 75 mg orally every day prior to admission. Now on clopidogrel 75 mg orally every day for secondary stroke prevention. Patient feels back to baseline this am.    Hx left brain stroke with resultant right hemiparesis 13 years ago Hyperlipidemia, LDL pending, on statin PTA, goal LDL < 100 Hypertension, 233/77 on arrival PVD Blindness in left eye GERD Arthritis polio affecting right leg  Hospital day # 1  TREATMENT/PLAN  Continue clopidogrel 75 mg orally every day for secondary stroke prevention.  OOB. Therapy evals.  F/u MRI, MRA, 2D, carotid dopplers  Annie Main, MSN, RN, ANVP-BC, ANP-BC, GNP-BC [facility] Pager: [phone] 02/02/2012 8:58 AM  I have personally obtained a history, examined the patient, evaluated imaging results, and formulated the assessment and plan of care. I agree with the above.   [facility] - Stroke Team Joycelyn Schmid, MD 02/02/2012 8:58 AM  Please refer to [email or web address] for on-call Stroke MD

## 2012-02-02 NOTE — Evaluation (Signed)
Occupational Therapy Evaluation and Discharge Patient Details Name: Lori Fox MRN: [id number] DOB: September 20, 1940 Today's Date: 02/02/2012 Time: 4098-1191 OT Time Calculation (min): 16 min  OT Assessment / Plan / Recommendation Clinical Impression  This 71 yo female admitted with headache, slurred speech, left sided weakness presents to acute OT at baseline (per her report and friend in room report). No further OT needs noted, will sign off.    OT Assessment  Patient does not need any further OT services    Follow Up Recommendations  No OT follow up       Equipment Recommendations  None recommended by OT          Precautions / Restrictions Precautions Precautions: Fall Precaution Comments: Has Bil upright AFOS, right forefoot amputation Restrictions Weight Bearing Restrictions: No       ADL  Transfers/Ambulation Related to ADLs: Min guard A for all ADL Comments: Pt is at her baseline per her report. She was able to get up to the EOB without A on the left side (the side she gets out of at home). Minguard A for ambulation in her room (says she does better with her (Bil upright) AFOs, but does not always wear these at home when she first gets up. Able to don gown once I gave it to her (she put her RUE in first)        Visit Information  Last OT Received On: 02/02/12 Assistance Needed: +1    Subjective Data  Patient Stated Goal: I want to go home for Christmas   Prior Functioning     Home Living Lives With: Friend(s) Available Help at Discharge: Friend(s);Available 24 hours/day Type of Home: House Home Access: Stairs to enter [employer] of Steps: 2 Entrance Stairs-Rails: Right Home Layout: One level Bathroom Shower/Tub: Walk-in shower;Door Teacher, early years/pre: Yes How Accessible: Accessible via walker Home Adaptive Equipment: Straight cane Prior Function Level of Independence: Independent with assistive device(s)  (SPC) Driving: No Vocation: Retired Musician: No difficulties Dominant Hand: Left (due to right sided deficits from old CVA (15 years ago))         Vision/Perception  Blind in left eye   Cognition  Overall Cognitive Status: Appears within functional limits for tasks assessed/performed Arousal/Alertness: Awake/alert Orientation Level: Appears intact for tasks assessed Behavior During Session: [facility] for tasks performed    Extremity/Trunk Assessment Right Upper Extremity Assessment RUE ROM/Strength/Tone: Deficits RUE ROM/Strength/Tone Deficits: Old CVA (non-functional) Left Upper Extremity Assessment LUE ROM/Strength/Tone: Within functional levels                 End of Session OT - End of Session Equipment Utilized During Treatment: Gait belt [facility]) Activity Tolerance: Patient tolerated treatment well Patient left:  (in W/C going down for MRI) Nurse Communication: Mobility status (nurse saw [place] walking in her room with SPC)       Evette Georges [phone] 02/02/2012, 12:30 PM

## 2012-02-02 NOTE — Progress Notes (Signed)
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 - 2-D echo showed EF of 55-65%, grade 3 diastolic dysfunction, no PFO - Carotid Doppler is pending -Continue Plavix , BP control, statin - EEG done results pending  Active Problems:  HYPERLIPIDEMIA: Placed on statin   HYPERTENSION, MALIGNANT ESSENTIAL: BP 233/77 on arrival - Placed on Norvasc, started on clonidine and beta blocker   GERD: Continue PPI  DVT Prophylaxis:Heparin subcutaneous  Code Status: DNR  Disposition:Hopefully tomorrow am    Subjective: States left-sided weakness improving, she has chronic right-sided hemiparesis  Objective: Weight change:   Intake/Output Summary (Last 24 hours) at 02/02/12 1526 Last data filed at 02/02/12 0858  Gross per 24 hour  Intake    360 ml  Output      0 ml  Net    360 ml   Blood pressure 166/57, pulse 58, temperature 98.3 F (36.8 C), temperature source Oral, resp. rate 18, SpO2 97.00%.  Physical Exam: General: Alert and awake, oriented x3, not in any acute distress. HEENT: anicteric sclera, pupils reactive to light and accommodation, CVS: S1-S2 clear, no murmur rubs or gallops Chest: clear to auscultation bilaterally, no wheezing, rales or rhonchi Abdomen: soft nontender, nondistended, normal bowel sounds, no organomegaly Extremities: no cyanosis, clubbing or edema noted bilaterally Neuro: Right-sided hemiparesis(chronic)  Lab Results: Basic Metabolic Panel:  Lab 02/02/12 8119 02/01/12 1505  NA -- 142  K -- 4.4  CL -- 106  CO2 -- 27  GLUCOSE -- 91  BUN -- 29*  CREATININE 1.38* 1.70*  CALCIUM -- 10.6*  MG -- --  PHOS -- --   Liver Function Tests:  Lab 02/01/12 1505  AST 13  ALT 9   ALKPHOS 156*  BILITOT 0.3  PROT 7.7  ALBUMIN 4.1   CBC:  Lab 02/02/12 0650 02/01/12 1505  WBC 7.6 5.7  NEUTROABS -- 4.1  HGB 13.6 13.6  HCT 38.4 39.3  MCV 93.2 95.6  PLT 190 197   CBG:  Lab 02/01/12 1506  GLUCAP 88     Micro Results: No results found for this or any previous visit (from the past 240 hour(s)).  Studies/Results: Ct Head Wo Contrast  02/01/2012  *RADIOLOGY REPORT*  Clinical Data: Spasms  CT HEAD WITHOUT CONTRAST  Technique:  Contiguous axial images were obtained from the base of the skull through the vertex without contrast.  Comparison: CT 09/22/2008  Findings: Chronic left MCA infarct is unchanged.  There is volume loss in the left with dilatation of the left lateral ventricle.  No acute infarct.  Negative for hemorrhage or mass lesion.  Mild chronic microvascular ischemia in the cerebral white matter on the right.  Negative for hemorrhage or mass.  Calvarium is intact.  IMPRESSION: Chronic left MCA infarct.  No acute abnormality.   Original Report Authenticated By: Janeece Riggers, M.D.    Mr Brain Wo Contrast  02/02/2012  *RADIOLOGY REPORT*  Clinical Data:  Headache and slurred speech with questionable new left-sided weakness.  Right-sided weakness from remote stroke. Hypertensive hyperlipidemic patient.  Blindness left eye.  MRI BRAIN WITHOUT CONTRAST MRA HEAD WITHOUT CONTRAST  Technique: Multiplanar, multiecho pulse sequences of the brain and surrounding structures were obtained according to standard protocol without intravenous contrast.  Angiographic images  of the head were obtained using MRA technique without contrast.  Comparison: 02/01/2012 head CT.  No comparison brain MR.  MRI HEAD  Findings:  Small rounded area of restricted motion right cerebellum.  In the present clinical setting this is most consistent with a tiny right cerebellar acute non hemorrhagic infarct rather than result of metastatic disease.  Remote large left hemispheric infarct involving left  middle cerebral artery distribution with large area of encephalomalacia and subsequent dilation of the left lateral ventricle.  The slight increased signal within the posterior aspect of the posterior limb of the right internal capsule extending to the right cerebral peduncle is felt to be a normal finding rather than acute infarct.  Moderate small vessel disease type changes.  Wallerian degeneration left cerebral peduncle and left pons.  Tiny remote infarcts cerebellum.  Abnormal appearance left internal carotid artery.  Please see below.  Atrophy without hydrocephalus.  No intracranial mass lesion detected on this unenhanced exam.  Minimal paranasal sinus mucosal thickening.  Minimal amount of blood breakdown products along the periphery of the remote large left hemispheric infarct otherwise no evidence of intracranial hemorrhage.  IMPRESSION: Tiny non hemorrhagic right cerebellar acute infarct.  Remote infarcts and small vessel disease type changes as discussed above.  MRA HEAD  Findings: Occluded left internal carotid artery.  Collateral flow with left carotid terminus visualized.  Left carotid terminus appears irregular and slightly narrowed.  Decreased number of visualized left middle cerebral artery branches consistent with the patient's remote infarct.  Moderate to marked focal narrowing A1-A2 junction of the left anterior cerebral artery.  Moderate narrowing supraclinoid aspect of the right internal carotid artery.  Left vertebral artery is dominant.  Right vertebral artery is of small caliber and irregular after the takeoff of the right PICA.  Mild narrowing portions of the right PICA.  No high-grade stenosis of the basilar artery.  Mild to moderate narrowing AICA greater on the right  Fetal type origin of the left posterior cerebral artery which is of decreased caliber throughout majority of its course.  Bulge at the right posterior communicating artery origin.  This may represent an infundibulum although  a tiny aneurysm is not excluded. Stability can be confirmed on follow-up.  IMPRESSION: Occluded left internal carotid artery.  Collateral flow with left carotid terminus visualized.  Left carotid terminus appears irregular and slightly narrowed.  Decreased number of visualized left middle cerebral artery branches consistent with the patient's remote infarct.  Moderate to marked focal narrowing A1-A2 junction of the left anterior cerebral artery.  Moderate narrowing supraclinoid aspect of the right internal carotid artery.  Left vertebral artery is dominant.  Right vertebral artery is of small caliber and irregular after the takeoff of the right PICA.  Mild narrowing portions of the right PICA.  Mild to moderate narrowing AICA greater on the right  Fetal type origin of the left posterior cerebral artery which is of decreased caliber throughout majority of its course.  Bulge at the right posterior communicating artery origin.  This may represent an infundibulum although a tiny aneurysm is not excluded. Stability can be confirmed on follow-up.  This has been made a PRA call report utilizing dashboard call feature.   Original Report Authenticated By: Lacy Duverney, M.D.    Mr Mra Head/brain Wo Cm  02/02/2012  *RADIOLOGY REPORT*  Clinical Data:  Headache and slurred speech with questionable new left-sided weakness.  Right-sided weakness from remote stroke. Hypertensive hyperlipidemic patient.  Blindness left eye.  MRI BRAIN  WITHOUT CONTRAST MRA HEAD WITHOUT CONTRAST  Technique: Multiplanar, multiecho pulse sequences of the brain and surrounding structures were obtained according to standard protocol without intravenous contrast.  Angiographic images of the head were obtained using MRA technique without contrast.  Comparison: 02/01/2012 head CT.  No comparison brain MR.  MRI HEAD  Findings:  Small rounded area of restricted motion right cerebellum.  In the present clinical setting this is most consistent with a tiny  right cerebellar acute non hemorrhagic infarct rather than result of metastatic disease.  Remote large left hemispheric infarct involving left middle cerebral artery distribution with large area of encephalomalacia and subsequent dilation of the left lateral ventricle.  The slight increased signal within the posterior aspect of the posterior limb of the right internal capsule extending to the right cerebral peduncle is felt to be a normal finding rather than acute infarct.  Moderate small vessel disease type changes.  Wallerian degeneration left cerebral peduncle and left pons.  Tiny remote infarcts cerebellum.  Abnormal appearance left internal carotid artery.  Please see below.  Atrophy without hydrocephalus.  No intracranial mass lesion detected on this unenhanced exam.  Minimal paranasal sinus mucosal thickening.  Minimal amount of blood breakdown products along the periphery of the remote large left hemispheric infarct otherwise no evidence of intracranial hemorrhage.  IMPRESSION: Tiny non hemorrhagic right cerebellar acute infarct.  Remote infarcts and small vessel disease type changes as discussed above.  MRA HEAD  Findings: Occluded left internal carotid artery.  Collateral flow with left carotid terminus visualized.  Left carotid terminus appears irregular and slightly narrowed.  Decreased number of visualized left middle cerebral artery branches consistent with the patient's remote infarct.  Moderate to marked focal narrowing A1-A2 junction of the left anterior cerebral artery.  Moderate narrowing supraclinoid aspect of the right internal carotid artery.  Left vertebral artery is dominant.  Right vertebral artery is of small caliber and irregular after the takeoff of the right PICA.  Mild narrowing portions of the right PICA.  No high-grade stenosis of the basilar artery.  Mild to moderate narrowing AICA greater on the right  Fetal type origin of the left posterior cerebral artery which is of decreased  caliber throughout majority of its course.  Bulge at the right posterior communicating artery origin.  This may represent an infundibulum although a tiny aneurysm is not excluded. Stability can be confirmed on follow-up.  IMPRESSION: Occluded left internal carotid artery.  Collateral flow with left carotid terminus visualized.  Left carotid terminus appears irregular and slightly narrowed.  Decreased number of visualized left middle cerebral artery branches consistent with the patient's remote infarct.  Moderate to marked focal narrowing A1-A2 junction of the left anterior cerebral artery.  Moderate narrowing supraclinoid aspect of the right internal carotid artery.  Left vertebral artery is dominant.  Right vertebral artery is of small caliber and irregular after the takeoff of the right PICA.  Mild narrowing portions of the right PICA.  Mild to moderate narrowing AICA greater on the right  Fetal type origin of the left posterior cerebral artery which is of decreased caliber throughout majority of its course.  Bulge at the right posterior communicating artery origin.  This may represent an infundibulum although a tiny aneurysm is not excluded. Stability can be confirmed on follow-up.  This has been made a PRA call report utilizing dashboard call feature.   Original Report Authenticated By: Lacy Duverney, M.D.     Medications: Scheduled Meds:   . ALPRAZolam  0.25 mg Oral BID  . amLODipine  10 mg Oral Daily  . atorvastatin  40 mg Oral q1800  . carvedilol  25 mg Oral BID WC  . cloNIDine  0.2 mg Oral TID  . clopidogrel  75 mg Oral Q breakfast  . escitalopram  10 mg Oral Daily  . fesoterodine  8 mg Oral Daily  . heparin  5,000 Units Subcutaneous Q8H  . pantoprazole  80 mg Oral Q1200      LOS: 1 day   RAI,RIPUDEEP M.D. [facility] 02/02/2012, 3:26 PM Pager: [phone]  If 7PM-7AM, please contact night-coverage [email or web address] Password [email or web address]

## 2012-02-02 NOTE — Progress Notes (Signed)
Speech Language Pathology Patient Details Name: Lori Fox MRN: [id number] DOB: February 23, 1940 Today's Date: 02/02/2012 Time:  -    Attempted speech-language-cognitive assessment, however, pt. currently in EEG.  Schedule may not allow ST to recheck this pm.  If pt .to be discharged before 12/26 (ST not here 12/25) and if there are concerns with speech-lang-cognition, referral can be made for outpatient ST services.  Breck Coons [place].Ed [employer] [phone]  02/02/2012

## 2012-02-02 NOTE — Progress Notes (Signed)
UR COMPLETED  

## 2012-02-02 NOTE — Progress Notes (Signed)
Echocardiogram 2D Echocardiogram has been performed.  Lori Fox 02/02/2012, 9:09 AM

## 2012-02-03 DIAGNOSIS — E785 Hyperlipidemia, unspecified: Secondary | ICD-10-CM

## 2012-02-03 DIAGNOSIS — K219 Gastro-esophageal reflux disease without esophagitis: Secondary | ICD-10-CM

## 2012-02-03 DIAGNOSIS — I6350 Cerebral infarction due to unspecified occlusion or stenosis of unspecified cerebral artery: Secondary | ICD-10-CM

## 2012-02-03 DIAGNOSIS — I10 Essential (primary) hypertension: Secondary | ICD-10-CM

## 2012-02-03 MED ORDER — HYDRALAZINE HCL 25 MG PO TABS
25.0000 mg | ORAL_TABLET | Freq: Two times a day (BID) | ORAL | Status: DC
  Administered 2012-02-03: 25 mg via ORAL
  Filled 2012-02-03 (×2): qty 1

## 2012-02-03 MED ORDER — AMLODIPINE BESYLATE 10 MG PO TABS: 10.0000 mg | ORAL_TABLET | Freq: Every day | ORAL | Status: DC

## 2012-02-03 MED ORDER — HYDRALAZINE HCL 25 MG PO TABS: 25.0000 mg | ORAL_TABLET | Freq: Two times a day (BID) | ORAL | Status: DC

## 2012-02-03 MED ORDER — OLMESARTAN MEDOXOMIL 20 MG PO TABS: 40.0000 mg | ORAL_TABLET | Freq: Every day | ORAL | Status: DC

## 2012-02-03 NOTE — Discharge Summary (Signed)
Physician Discharge Summary  Patient ID: Lori Fox MRN: [id number] DOB/AGE: January 14, 1941 71 y.o.  Admit date: 02/01/2012 Discharge date: 02/03/2012  Primary Care Physician:  Carrie Mew, MD  Discharge Diagnoses:    . HYPERTENSION, MALIGNANT ESSENTIAL . acute right cerebellar CVA (cerebral infarction) . HYPERLIPIDEMIA . GERD  Consults: Neurology/stroke service   Discharge Medications:   Medication List     As of 02/03/2012 11:04 AM    STOP taking these medications         Olmesartan-Amlodipine-HCTZ 40-10-25 MG Tabs      TAKE these medications         ALPRAZolam 0.25 MG tablet   Commonly known as: XANAX   take 1 tablet by mouth twice a day      amLODipine 10 MG tablet   Commonly known as: NORVASC   Take 1 tablet (10 mg total) by mouth daily.      carvedilol 25 MG tablet   Commonly known as: COREG   TAKE 1 TABLET TWICE DAILY      cloNIDine 0.2 MG tablet   Commonly known as: CATAPRES   TAKE 1 TABLET THREE TIMES A DAY      clopidogrel 75 MG tablet   Commonly known as: PLAVIX   Take 75 mg by mouth daily.      cyclobenzaprine 10 MG tablet   Commonly known as: FLEXERIL   Take 10 mg by mouth at bedtime as needed. Muscle spasms      escitalopram 10 MG tablet   Commonly known as: LEXAPRO   TAKE 1 TABLET BY MOUTH ONCE DAILY      esomeprazole 40 MG capsule   Commonly known as: NEXIUM   Take 40 mg by mouth daily as needed. Acid reflux      hydrALAZINE 25 MG tablet   Commonly known as: APRESOLINE   Take 1 tablet (25 mg total) by mouth 2 (two) times daily.      rosuvastatin 20 MG tablet   Commonly known as: CRESTOR   Take 20 mg by mouth once a week. At night on fridays      tolterodine 4 MG 24 hr capsule   Commonly known as: DETROL LA   Take 1 capsule (4 mg total) by mouth daily. At  6  PM      zolpidem 10 MG tablet   Commonly known as: AMBIEN   take 1 tablet by mouth at bedtime if needed for sleep         Brief H and P: For complete  details please refer to admission H and P, but in brief the patient is a 71 year old female who presented with headache, slurred speech and difficulty getting out of the bed at 11 AM a on the morning of admission when she woke up with questionable new left-sided weakness as well. Her symptoms had rapidly resolved after arrival to the ED. Patient was admitted for further workup. She previously had a large left-sided MCA stroke in the past which had left her mostly paralyzed on her right side of the body at baseline.  Hospital Course:  Acute right cerebellar CVA (cerebral infarction); right brain TIA led to left hemiparesis, now resolved. Etiology of stroke and TIA in 2 different distrubution leads up to suspect an embolic etiology. Neurology recommended out-patient w/u for embolic source. MRI showed tiny nonhemorrhagic right cerebellar acute infarct. 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 2-D echo showed EF of 55-65%, grade 3 diastolic dysfunction, no PFO. Carotid Dopplers showed  occluded left internal carotid artery, vertebral flow antegrade. Continue Plavix , BP control, statin.   EEG was also done which showed abnormal secondary to left hemispheric slowing and mid temporal sharp transients consistent with previous history of stroke 13 years ago.   I contacted [facility] cardiology to arrange for Holter monitor and TEE out-patient, the office will open tomorrow and they will contact the patient to arrange, patient information given to [facility], [facility].  Marland Kitchen   HYPERLIPIDEMIA: Placed on statin   HYPERTENSION, MALIGNANT ESSENTIAL: BP 233/77 on arrival. As patient's creatinine was 1.7 at the time of admission, the combination of Norvasc/ARB/HCTZ was discontinued. Patient was continued on beta blocker, clonidine, Norvasc was added and hydralazine. She should have repeat followup with her PCP within next 7-10 days to adjust her medications.  Cr is 1.3 at DC.  GERD: Continue PPI    Day of  Discharge BP 151/47  Pulse 58  Temp 98.4 F (36.9 C) (Oral)  Resp 18  Ht 5\' 9"  (1.753 m)  Wt 74.9 kg (165 lb 2 oz)  BMI 24.38 kg/m2  SpO2 98%  Physical Exam: General: Alert and awake oriented x3 not in any acute distress. HEENT: anicteric sclera, pupils reactive to light and accommodation CVS: S1-S2 clear no murmur rubs or gallops Chest: clear to auscultation bilaterally, no wheezing rales or rhonchi Abdomen: soft nontender, nondistended, normal bowel sounds, no organomegaly Extremities: no cyanosis, clubbing or edema noted bilaterally Neuro: Chronic right-sided hemiparesis  The results of significant diagnostics from this hospitalization (including imaging, microbiology, ancillary and laboratory) are listed below for reference.    LAB RESULTS: Basic Metabolic Panel:  Lab 02/02/12 5621 02/01/12 1505  NA -- 142  K -- 4.4  CL -- 106  CO2 -- 27  GLUCOSE -- 91  BUN -- 29*  CREATININE 1.38* 1.70*  CALCIUM -- 10.6*  MG -- --  PHOS -- --   Liver Function Tests:  Lab 02/01/12 1505  AST 13  ALT 9  ALKPHOS 156*  BILITOT 0.3  PROT 7.7  ALBUMIN 4.1   CBC:  Lab 02/02/12 0650 02/01/12 1505  WBC 7.6 5.7  NEUTROABS -- 4.1  HGB 13.6 13.6  HCT 38.4 39.3  MCV 93.2 --  PLT 190 197   CBG:  Lab 02/01/12 1506  GLUCAP 88    Significant Diagnostic Studies:  Ct Head Wo Contrast  02/01/2012  *RADIOLOGY REPORT*  Clinical Data: Spasms  CT HEAD WITHOUT CONTRAST  Technique:  Contiguous axial images were obtained from the base of the skull through the vertex without contrast.  Comparison: CT 09/22/2008  Findings: Chronic left MCA infarct is unchanged.  There is volume loss in the left with dilatation of the left lateral ventricle.  No acute infarct.  Negative for hemorrhage or mass lesion.  Mild chronic microvascular ischemia in the cerebral white matter on the right.  Negative for hemorrhage or mass.  Calvarium is intact.  IMPRESSION: Chronic left MCA infarct.  No acute  abnormality.   Original Report Authenticated By: Janeece Riggers, M.D.      Disposition and Follow-up:     Discharge Orders    Future Appointments: Provider: Department: Dept Phone: Center:   03/28/2012 3:45 PM Stacie Glaze, MD [facility] [place] [phone] [facility]     Future Orders Please Complete By Expires   Diet - low sodium heart healthy      Increase activity slowly          DISPOSITION: Home  DIET:  Heart healthy diet  ACTIVITY: As tolerated  DISCHARGE FOLLOW-UP Follow-up Information    Follow up with Carrie Mew, MD. Schedule an appointment as soon as possible for a visit in 10 days. (for hospital follow-up, adjust BP medications)    Contact information:   [address] Christena Flake [facility] [place] [address] [phone]       Follow up with Gates Rigg, MD. Schedule an appointment as soon as possible for a visit in 2 months. (stroke follow-up)    Contact information:   [address] [facility] [place] [address] [phone]          Time spent on Discharge: 40 minutes  Signed:   RAI,RIPUDEEP M.D. [facility] 02/03/2012, 11:04 AM Pager: [phone]

## 2012-02-03 NOTE — Procedures (Signed)
EEG NUMBER:  REFERRING PHYSICIAN:  Dr. Julian Reil.  HISTORY:  A 71 year old female with transient twitching and left hand weakness evaluated to rule out seizure.  MEDICATIONS:  Xanax, Norvasc, Coreg, Catapres, Plavix, Lexapro, TOBI, heparin, Protonix.  CONDITION OF RECORDING:  This is a 16-channel EEG carried out patient in the awake and drowsy state.  DESCRIPTION:  The waking background activity consists of a low-voltage, symmetrical, fairly well-organized 8-9 Hz, alpha activity seen from the parieto-occipital and posterotemporal regions.  Low-voltage, fast activity, poorly organized was seen anteriorly at times, superimposed on more posterior rhythms.  A mixture of theta and alpha was seen from the central and temporal regions.  Intermittently over the left hemisphere was noted slowing of the background rhythm with an underlying polymorphic delta activity.  Also seen on occasion over the left hemisphere were mid temporal sharp transients.  The patient drowses with slowing to irregular theta and beta activity.  Stage II sleep was not attained.  Hyperventilation was not performed.  Intermittent photic stimulation failed to elicit any change in the tracing.  IMPRESSION:  This is an abnormal EEG secondary to left hemispheric slowing and midtemporal sharp transients.  This finding is consistent with the patient's history of a stroke 13 years ago and may suggest epileptogenic potential.          ______________________________ Thana Farr, MD    AV:[id number] D:  02/02/2012 18:36:16  T:  02/03/2012 04:08:02  Job #:  [id number]

## 2012-02-03 NOTE — Progress Notes (Signed)
Received request from Dr. Isidoro Donning regarding outpatient cardiology studies of TEE/event monitor. I left msg with our schedulers to call her to arrange 21 day continuous heart monitor - per protocol, will be listed under Dr. Henrietta Hoover name since he is DOD today. (Pt has [facility] PCP.) Note that we have not seen the patient in consult but will be facilitating these studies. I will look into how to schedule outpatient TEE when office reopens tomorrow and we will contact her with this info. Suzann Lazaro PA-C

## 2012-02-03 NOTE — Progress Notes (Signed)
Stroke Team Progress Note  HISTORY Lori Fox is an 71 y.o. female history of left MCA stroke and right hemiparesis, hyperlipidemia, hypertension and right carotid endarterectomy, presenting with history of transient twitching and weakness of the left hand this morning 02/02/2012 followed by slurred speech. Patient has been on Plavix 75 mg per day. Deficits resolved prior to arriving in the emergency room. CT scan of her head showed no acute intracranial abnormality. Blood pressure was noted to be elevated at 233/77. She was given IV hydralazine. Most recent blood pressure was 183/67.Patient was not a TPA candidate secondary to rapidly resolving deficits. She was admitted for further evaluation and treatment.  SUBJECTIVE Patient on edge of bed. Wants to go home. No new events. Stable.  OBJECTIVE Most recent Vital Signs: Filed Vitals:   02/02/12 1850 02/02/12 1900 02/02/12 2157 02/03/12 0500  BP:   172/48 154/55  Pulse: 60  57 55  Temp: 97.6 F (36.4 C)  98.2 F (36.8 C) 98.1 F (36.7 C)  TempSrc: Oral  Oral Oral  Resp: 18  18 18   Height:  5\' 9"  (1.753 m)    Weight:  74.9 kg (165 lb 2 oz)    SpO2: 97%  97% 98%   CBG (last 3)   Basename 02/01/12 1506  GLUCAP 88   MEDICATIONS    . ALPRAZolam  0.25 mg Oral BID  . amLODipine  10 mg Oral Daily  . atorvastatin  40 mg Oral q1800  . carvedilol  25 mg Oral BID WC  . cloNIDine  0.2 mg Oral TID  . clopidogrel  75 mg Oral Q breakfast  . escitalopram  10 mg Oral Daily  . fesoterodine  8 mg Oral Daily  . heparin  5,000 Units Subcutaneous Q8H  . pantoprazole  80 mg Oral Q1200   PRN:  cyclobenzaprine, hydrALAZINE, zolpidem  Diet:  Cardiac thin liquids Activity:  OOB with assistance DVT Prophylaxis:  Heparin 5000 units sq tid  CLINICALLY SIGNIFICANT STUDIES Basic Metabolic Panel:  Lab 02/02/12 0981 02/01/12 1505  NA -- 142  K -- 4.4  CL -- 106  CO2 -- 27  GLUCOSE -- 91  BUN -- 29*  CREATININE 1.38* 1.70*  CALCIUM -- 10.6*   MG -- --  PHOS -- --   Liver Function Tests:  Lab 02/01/12 1505  AST 13  ALT 9  ALKPHOS 156*  BILITOT 0.3  PROT 7.7  ALBUMIN 4.1   CBC:  Lab 02/02/12 0650 02/01/12 1505  WBC 7.6 5.7  NEUTROABS -- 4.1  HGB 13.6 13.6  HCT 38.4 39.3  MCV 93.2 95.6  PLT 190 197   Coagulation:  Lab 02/01/12 1505  LABPROT 14.0  INR 1.09   Cardiac Enzymes: No results found for this basename: CKTOTAL:3,CKMB:3,CKMBINDEX:3,TROPONINI:3 in the last 168 hours Urinalysis:   Lab 02/01/12 1500  COLORURINE YELLOW  LABSPEC 1.009  PHURINE 5.5  GLUCOSEU NEGATIVE  HGBUR NEGATIVE  BILIRUBINUR NEGATIVE  KETONESUR NEGATIVE  PROTEINUR NEGATIVE  UROBILINOGEN 1.0  NITRITE NEGATIVE  LEUKOCYTESUR NEGATIVE   Lipid Panel    Component Value Date/Time   CHOL 148 02/02/2012 0650   TRIG 131 02/02/2012 0650   HDL 36* 02/02/2012 0650   CHOLHDL 4.1 02/02/2012 0650   VLDL 26 02/02/2012 0650   LDLCALC 86 02/02/2012 0650   HgbA1C  Lab Results  Component Value Date   HGBA1C 6.0* 02/02/2012    Urine Drug Screen:     Component Value Date/Time   LABOPIA NONE DETECTED 02/01/2012 1454  COCAINSCRNUR NONE DETECTED 02/01/2012 1454   LABBENZ NONE DETECTED 02/01/2012 1454   AMPHETMU NONE DETECTED 02/01/2012 1454   THCU NONE DETECTED 02/01/2012 1454   LABBARB NONE DETECTED 02/01/2012 1454    Alcohol Level: No results found for this basename: ETH:2 in the last 168 hours  CT of the brain  02/01/2012  Chronic left MCA infarct.  No acute abnormality.  MRI of the brain  02/02/2012  Tiny non hemorrhagic right cerebellar acute infarct.  Remote infarcts and small vessel disease type changes.    MRA of the brain  02/02/2012   Occluded left internal carotid artery.  Collateral flow with left carotid terminus visualized.  Left carotid terminus appears irregular and slightly narrowed.  Decreased number of visualized left middle cerebral artery branches consistent with the patient's remote infarct.  Moderate to marked  focal narrowing A1-A2 junction of the left anterior cerebral artery.  Moderate narrowing supraclinoid aspect of the right internal carotid artery.  Left vertebral artery is dominant.  Right vertebral artery is of small caliber and irregular after the takeoff of the right PICA.  Mild narrowing portions of the right PICA.  Mild to moderate narrowing AICA greater on the right  Fetal type origin of the left posterior cerebral artery which is of decreased caliber throughout majority of its course.  Bulge at the right posterior communicating artery origin.  This may represent an infundibulum although a tiny aneurysm is not excluded. Stability can be confirmed on follow-up.  .    2D Echocardiogram  EF 55-60% with no source of embolus.   Carotid Doppler  Right: No evidence of hemodynamically significant internal carotid artery stenosis. CEA patent. Left: Occluded internal carotid artery vs trickle flow. Bilateral: Vertebral artery flow is antegrade.   CXR    EKG  normal sinus rhythm.   EEG  This is an abnormal EEG secondary to left hemispheric slowing and mid temple sharp transient. This finding is consistent with the patient's history of a stroke 13 years ago and may suggest epileptogenic potential.  Therapy Recommendations none  Physical Exam   GENERAL EXAM:  Patient is in no distress  CARDIOVASCULAR:  Regular rate and rhythm, no murmurs, no carotid bruits  NEUROLOGIC:  MENTAL STATUS: awake, alert, language fluent, comprehension intact, naming intact  CRANIAL NERVE: RIGHT PUPIL REACTS. LEFT PUPIL NO REACTION. LEFT EXOTROPIA. NO VISION FROM LEFT EYE. RIGHT EYE visual fields full to confrontation, extraocular muscles intact, no nystagmus, facial sensation and strength symmetric, uvula midline, shoulder shrug symmetric, tongue midline.  MOTOR: RUE (1-2/5 PROX, 0/5 DISTAL) AND RLE (1-2 PROX). RIGHT FOOT AMPUTATION. LUE AND LLE FULL STRENGTH.  SENSORY: normal and symmetric to light touch, pinprick   COORDINATION: LIMITED IN RIGHT SIDE DUE TO WEAKNESS. NO ATAXIA IN LUE LLE.  REFLEXES: BRISK IN RUE AND RLE.   ASSESSMENT Lori Fox is a 71 y.o. left handed female presenting with transient twitching, left hand weakness followed by slurred speech in setting of left brain stroke 13 years ago and accelerated hypertension. MRI shows silent right cerebellar tiny infarct. right brain TIA led to left hemiparesis, now resolved. Etiology of stroke and TIA in 2 different distrubution leads up to suspect an embolic etiology. Recommend embolic Work up as an OP. On clopidogrel 75 mg orally every day prior to admission. Now on clopidogrel 75 mg orally every day for secondary stroke prevention. Patient feels remains at baseline.    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 Recommend outpatient TEE to look for embolic source. Please arrange with pts cardiologist of choice. Will need to be NPO after midnight. If positive for PFO (patent foramen ovale), check bilateral lower extremity venous dopplers to rule out DVT as possible source of stroke.  Please schedule outpatient telemetry monitoring to assess patient for atrial fibrillation as source of stroke. May be arranged with patient's cardiologist, or cardiologist of choice.   Ok for discharge from stroke standpoint  Annie Main, MSN, RN, ANVP-BC, ANP-BC, [facility] Pager: [phone] 02/03/2012 9:04 AM  I have personally obtained a history, examined the patient, evaluated imaging results, and formulated the assessment and plan of care. I agree with the above.   [facility] -  Stroke Team Joycelyn Schmid, MD 02/03/2012 9:04 AM  Please refer to [email or web address] for on-call Stroke MD

## 2012-02-05 ENCOUNTER — Telehealth: Payer: Self-pay | Admitting: Diagnostic Neuroimaging

## 2012-02-05 ENCOUNTER — Telehealth: Payer: Self-pay | Admitting: *Deleted

## 2012-02-05 ENCOUNTER — Encounter: Payer: Self-pay | Admitting: *Deleted

## 2012-02-05 ENCOUNTER — Encounter: Payer: Self-pay | Admitting: Physician Assistant

## 2012-02-05 NOTE — Telephone Encounter (Signed)
New problem:    Per Annabelle Harman PA calling from [facility].  Pt is already discharge from hospital need to be set up for outpatient TEE.

## 2012-02-05 NOTE — Telephone Encounter (Signed)
I spoke with pt caregiver and POA, Steward Drone, about scheduling TEE. This was in the discharge notes of her recent hospitalization.  TEE scheduled for 02/18/12 at 11:00am.  She is aware of date and time. Instruction letter mailed to pt. Mylo Red RN

## 2012-02-05 NOTE — Progress Notes (Unsigned)
Discussed request for outpatient TEE with our scheduling dept who will be forwarding to triage to help arrange. Appreciate their help. This study was recommended by neuro team who saw patient at [facility] for stroke. Dayna Dunn PA-C

## 2012-02-05 NOTE — Telephone Encounter (Signed)
Please mail 21 day event monitor to patient home. Per [name] caregiver. 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

## 2012-02-08 ENCOUNTER — Other Ambulatory Visit: Payer: Self-pay

## 2012-02-08 ENCOUNTER — Telehealth: Payer: Self-pay | Admitting: *Deleted

## 2012-02-08 ENCOUNTER — Other Ambulatory Visit: Payer: Self-pay | Admitting: Physician Assistant

## 2012-02-08 DIAGNOSIS — I639 Cerebral infarction, unspecified: Secondary | ICD-10-CM

## 2012-02-08 DIAGNOSIS — I4891 Unspecified atrial fibrillation: Secondary | ICD-10-CM

## 2012-02-08 NOTE — Telephone Encounter (Signed)
Pt. Enrolled for event monitor to be mailed to home 02/08/12. TK

## 2012-02-11 ENCOUNTER — Telehealth: Payer: Self-pay | Admitting: Internal Medicine

## 2012-02-11 NOTE — Telephone Encounter (Signed)
Patient Information:  Caller Name: Steward Drone  Phone: [phone]  Patient: Lori Fox, Lori Fox  Gender: Female  DOB: 03/18/1940  Age: 72 Years  PCP: Darryll Capers (Adults only)  Office Follow Up:  Does the office need to follow up with this patient?: Yes  Instructions For The Office: Needs an appt. in the next 2 weeks as a f/u please.  RN Note:  Denies any sx.  Has a f/u with the neurologist in 2 months.  Patient is up walking talking and normally.  Encouraged to take her medication as ordered.   Please call to schedule an appt. in the next 2 weeks.  Her next OV is not until 2/17 with Dr. Lovell Sheehan.  Symptoms  Reason For Call & Symptoms: Steward Drone, care giver calling.  Her b/p is 188/67.  Reviewed Health History In EMR: N/A  Reviewed Medications In EMR: N/A  Reviewed Allergies In EMR: N/A  Reviewed Surgeries / Procedures: N/A  Date of Onset of Symptoms: 02/10/2012  Guideline(s) Used:  High Blood Pressure  Disposition Per Guideline:   See Within 2 Weeks in Office  Reason For Disposition Reached:   BP > 130/80 and history of heart problems, kidney disease, or diabetes  Advice Given:  N/A

## 2012-02-11 NOTE — Telephone Encounter (Signed)
appt with padonda tomorrow-caregive advised

## 2012-02-12 ENCOUNTER — Encounter: Payer: Self-pay | Admitting: Family

## 2012-02-12 ENCOUNTER — Ambulatory Visit (INDEPENDENT_AMBULATORY_CARE_PROVIDER_SITE_OTHER): Payer: Medicare Other | Admitting: Family

## 2012-02-12 VITALS — BP 170/70 | HR 87 | Wt 165.0 lb

## 2012-02-12 DIAGNOSIS — R7309 Other abnormal glucose: Secondary | ICD-10-CM

## 2012-02-12 DIAGNOSIS — I10 Essential (primary) hypertension: Secondary | ICD-10-CM

## 2012-02-12 DIAGNOSIS — K117 Disturbances of salivary secretion: Secondary | ICD-10-CM

## 2012-02-12 DIAGNOSIS — R739 Hyperglycemia, unspecified: Secondary | ICD-10-CM

## 2012-02-12 DIAGNOSIS — R682 Dry mouth, unspecified: Secondary | ICD-10-CM

## 2012-02-12 MED ORDER — SOLIFENACIN SUCCINATE 5 MG PO TABS: 10.0000 mg | ORAL_TABLET | Freq: Every day | ORAL | Status: DC

## 2012-02-12 MED ORDER — HYDRALAZINE HCL 50 MG PO TABS: 50.0000 mg | ORAL_TABLET | Freq: Two times a day (BID) | ORAL | Status: DC

## 2012-02-12 NOTE — Progress Notes (Signed)
Patient ID: Lori Fox, female   DOB: March 12, 1940, 72 y.o.   MRN: [id number] Patient 's family member called today about hooking up the patient to the monitor. She stated that she talked to ecardio about the patient wearing the monitor do to the fact that the patient had a stroke and could not press the event button. Ecardio told her to called the MD's office.I explained to the family that the patient did not have to press the event button and the monitor will still record. The family member called me back at a later time and I explained to her and her husband how to make sure the monitor was recording over the phone. I also called E[employer] to make sure that they was receiving recordings. I called the family member to let her know that every thing was working with the monitor.

## 2012-02-12 NOTE — Progress Notes (Signed)
Subjective:    Patient ID: Lori Fox, female    DOB: 03-18-1940, 72 y.o.   MRN: [id number]  HPI 72 year old white female, nonsmoker, patient of Dr. Lovell Sheehan is in today with complaints of dry mouth and elevated blood pressure. Her blood pressure at home has been between 160-180 systolically. She's currently taking hydralazine 25 mg twice a day, Coreg and Norvasc 10 mg. She also takes Detrol LA once daily. Has bedwetting at night. Her care provider reports that she drinks tea constantly throughout the day. Never drinks water. She has also been hyperglycemic. Most recent blood sugar 126, A1c 6.0. Denies any lightheadedness, dizziness, chest pain, palpitations, shortness of breath or edema.    .Review of Systems  Constitutional: Negative.   HENT: Negative.   Respiratory: Negative.   Cardiovascular: Negative.   Gastrointestinal: Negative.   Musculoskeletal: Negative.   Skin: Negative.   Neurological: Negative.   Hematological: Negative.   Psychiatric/Behavioral: Negative.    Past Medical History  Diagnosis Date  . Hyperlipidemia   . Stroke   . Hypertension   . Arthritis   . PVD (peripheral vascular disease)   . Blindness of left eye   . Allergy   . GERD (gastroesophageal reflux disease)     History   Social History  . Marital Status: Widowed    Spouse Name: N/A    Number of Children: N/A  . Years of Education: N/A   Occupational History  . Not on file.   Social History Main Topics  . Smoking status: Former Games developer  . Smokeless tobacco: Not on file  . Alcohol Use: No  . Drug Use: No  . Sexually Active: No   Other Topics Concern  . Not on file   Social History Narrative  . No narrative on file    Past Surgical History  Procedure Date  . Carotid endarterectomy   . Foot surgery rt metatarsal   . Rt knee arthroscopic     Family History  Problem Relation Age of Onset  . Cancer Father     head and neck  . Alzheimer's disease Mother     Allergies    Allergen Reactions  . Codeine Sulfate     REACTION: welts, hives    Current Outpatient Prescriptions on File Prior to Visit  Medication Sig Dispense Refill  . ALPRAZolam (XANAX) 0.25 MG tablet take 1 tablet by mouth twice a day  60 tablet  5  . amLODipine (NORVASC) 10 MG tablet Take 1 tablet (10 mg total) by mouth daily.  60 tablet  3  . carvedilol (COREG) 25 MG tablet TAKE 1 TABLET TWICE DAILY  60 tablet  3  . cloNIDine (CATAPRES) 0.2 MG tablet TAKE 1 TABLET THREE TIMES A DAY  90 tablet  11  . clopidogrel (PLAVIX) 75 MG tablet Take 75 mg by mouth daily.      . cyclobenzaprine (FLEXERIL) 10 MG tablet Take 10 mg by mouth at bedtime as needed. Muscle spasms      . escitalopram (LEXAPRO) 10 MG tablet TAKE 1 TABLET BY MOUTH ONCE DAILY  30 tablet  5  . esomeprazole (NEXIUM) 40 MG capsule Take 40 mg by mouth daily as needed. Acid reflux      . hydrALAZINE (APRESOLINE) 50 MG tablet Take 1 tablet (50 mg total) by mouth 2 (two) times daily.  60 tablet  3  . rosuvastatin (CRESTOR) 20 MG tablet Take 20 mg by mouth once a week. At night on fridays      .  zolpidem (AMBIEN) 10 MG tablet take 1 tablet by mouth at bedtime if needed for sleep  30 tablet  5    BP 170/70  Pulse 87  Wt 165 lb (74.844 kg)  SpO2 96%chart     Objective:   Physical Exam  Constitutional: She is oriented to person, place, and time. She appears well-developed and well-nourished.  Neck: Normal range of motion. Neck supple.  Cardiovascular: Normal rate, regular rhythm and normal heart sounds.   Pulmonary/Chest: Effort normal and breath sounds normal.  Abdominal: Soft. Bowel sounds are normal.  Neurological: She is alert and oriented to person, place, and time.  Skin: Skin is warm and dry.  Psychiatric: She has a normal mood and affect.          Assessment & Plan:  Assessment: Hypertension, Dry Mouth, and Hyperglycemia  Plan: Increase hydralazine to 50 mg once daily. DC Detrol LA and try VESIcare 5 mg once daily.  Strongly encouraged her to decrease her caffeine consumption medical for a more water. Have advised the caffeine as a diuretic and an ear is in for the bladder creating more accidents. It and it is also not good for her blood sugar. recheck with Dr. Lovell Sheehan next month and as needed sooner.

## 2012-02-12 NOTE — Patient Instructions (Signed)
Finish Hydralazine 25mg  (2) twice a day. When you fill the new prescription (50mg ), only take 1 twice a day.  YOU MUST DECREASE YOUR CAFFEINE IN TAKE. DRINK MORE WATER, LESS TEA.

## 2012-02-15 ENCOUNTER — Emergency Department (HOSPITAL_COMMUNITY)
Admission: EM | Admit: 2012-02-15 | Discharge: 2012-02-15 | Disposition: A | Discharge status: Home / Self Care | Payer: Medicare Other | Attending: Emergency Medicine | Admitting: Emergency Medicine

## 2012-02-15 ENCOUNTER — Emergency Department (HOSPITAL_COMMUNITY): Payer: Medicare Other

## 2012-02-15 ENCOUNTER — Encounter (HOSPITAL_COMMUNITY): Payer: Self-pay | Admitting: Emergency Medicine

## 2012-02-15 DIAGNOSIS — R0789 Other chest pain: Secondary | ICD-10-CM | POA: Insufficient documentation

## 2012-02-15 DIAGNOSIS — Z8679 Personal history of other diseases of the circulatory system: Secondary | ICD-10-CM | POA: Insufficient documentation

## 2012-02-15 DIAGNOSIS — Z8719 Personal history of other diseases of the digestive system: Secondary | ICD-10-CM | POA: Insufficient documentation

## 2012-02-15 DIAGNOSIS — Z87891 Personal history of nicotine dependence: Secondary | ICD-10-CM | POA: Insufficient documentation

## 2012-02-15 DIAGNOSIS — Z8673 Personal history of transient ischemic attack (TIA), and cerebral infarction without residual deficits: Secondary | ICD-10-CM | POA: Insufficient documentation

## 2012-02-15 DIAGNOSIS — H5440 Blindness, one eye, unspecified eye: Secondary | ICD-10-CM | POA: Insufficient documentation

## 2012-02-15 DIAGNOSIS — Z7982 Long term (current) use of aspirin: Secondary | ICD-10-CM | POA: Insufficient documentation

## 2012-02-15 DIAGNOSIS — F419 Anxiety disorder, unspecified: Secondary | ICD-10-CM

## 2012-02-15 DIAGNOSIS — I10 Essential (primary) hypertension: Secondary | ICD-10-CM | POA: Insufficient documentation

## 2012-02-15 DIAGNOSIS — Z8739 Personal history of other diseases of the musculoskeletal system and connective tissue: Secondary | ICD-10-CM | POA: Insufficient documentation

## 2012-02-15 DIAGNOSIS — Z79899 Other long term (current) drug therapy: Secondary | ICD-10-CM | POA: Insufficient documentation

## 2012-02-15 DIAGNOSIS — F411 Generalized anxiety disorder: Secondary | ICD-10-CM | POA: Insufficient documentation

## 2012-02-15 DIAGNOSIS — E785 Hyperlipidemia, unspecified: Secondary | ICD-10-CM | POA: Insufficient documentation

## 2012-02-15 LAB — BASIC METABOLIC PANEL
CO2: 21 mEq/L (ref 19–32)
Chloride: 103 mEq/L (ref 96–112)
Creatinine, Ser: 1.43 mg/dL — ABNORMAL HIGH (ref 0.50–1.10)
Glucose, Bld: 133 mg/dL — ABNORMAL HIGH (ref 70–99)

## 2012-02-15 NOTE — ED Notes (Signed)
Pt brought to ED by EMS with SOB.As per pt she was having a anxiety attack.

## 2012-02-15 NOTE — ED Notes (Signed)
Pt brought to ED by EMS with SOB.Pt denies any chest pain and complains of SOB and states that its more of a anxiety attack.Pt is painfree and has no complains at present.

## 2012-02-15 NOTE — ED Provider Notes (Signed)
History     CSN: [id number]  Arrival date & time 02/15/12  0137   First MD Initiated Contact with Patient 02/15/12 0149      Chief Complaint  Patient presents with  . Shortness of Breath    (Consider location/radiation/quality/duration/timing/severity/associated sxs/prior treatment) HPI 72 year old female presents emergency department via EMS from home with complaint of chest pressure or shortness of breath associated with severe anxiety. Friend with her reports blood pressure at home was in the 180s to 200s systolic. Patient has history of poorly controlled blood pressure despite being on multiple medications. Patient is asymptomatic now. Case discussed with her healthcare proxy, Steward Drone, who reports patient frequently has these episodes in the evenings where she gets very anxious and complains of shortness breath and chest pressure. With that her blood pressure becomes elevated. Patient was seen on Friday by her primary care Dr. and had her hydralazine increased from 25 twice a day to 50 twice a day. Patient is currently on Holter monitor after having a stroke recently. She is waiting cardiology followup after her Holter monitor has completed. She is asymptomatic at this time without complaints. Her caretakers are frustrated with her poorly controlled blood pressure and that it has not gotten better with medications.  Past Medical History  Diagnosis Date  . Hyperlipidemia   . Stroke   . Hypertension   . Arthritis   . PVD (peripheral vascular disease)   . Blindness of left eye   . Allergy   . GERD (gastroesophageal reflux disease)     Past Surgical History  Procedure Date  . Carotid endarterectomy   . Foot surgery rt metatarsal   . Rt knee arthroscopic     Family History  Problem Relation Age of Onset  . Cancer Father     head and neck  . Alzheimer's disease Mother     History  Substance Use Topics  . Smoking status: Former Games developer  . Smokeless tobacco: Not on file  .  Alcohol Use: No    OB History    Grav Para Term Preterm Abortions TAB SAB Ect Mult Living                  Review of Systems  See History of Present Illness; otherwise all other systems are reviewed and negative  Allergies  Codeine sulfate  Home Medications   Current Outpatient Rx  Name  Route  Sig  Dispense  Refill  . ALPRAZOLAM 0.25 MG PO TABS      take 1 tablet by mouth twice a day   60 tablet   5   . AMLODIPINE BESYLATE 10 MG PO TABS   Oral   Take 1 tablet (10 mg total) by mouth daily.   60 tablet   3   . ASPIRIN 81 MG PO CHEW   Oral   Chew 362 mg by mouth once.         Marland Kitchen CARVEDILOL 25 MG PO TABS      TAKE 1 TABLET TWICE DAILY   60 tablet   3   . CLONIDINE HCL 0.2 MG PO TABS               . CLOPIDOGREL BISULFATE 75 MG PO TABS   Oral   Take 75 mg by mouth daily.         . CYCLOBENZAPRINE HCL 10 MG PO TABS   Oral   Take 10 mg by mouth at bedtime as needed. Muscle spasms         .  ESCITALOPRAM OXALATE 10 MG PO TABS      TAKE 1 TABLET BY MOUTH ONCE DAILY   30 tablet   5     Refill Approved   . ESOMEPRAZOLE MAGNESIUM 40 MG PO CPDR   Oral   Take 40 mg by mouth daily as needed. Acid reflux         . HYDRALAZINE HCL 50 MG PO TABS   Oral   Take 1 tablet (50 mg total) by mouth 2 (two) times daily.   60 tablet   3   . ROSUVASTATIN CALCIUM 20 MG PO TABS   Oral   Take 20 mg by mouth once a week. At night on fridays         . SOLIFENACIN SUCCINATE 5 MG PO TABS   Oral   Take 2 tablets (10 mg total) by mouth daily.   30 tablet   3   . ZOLPIDEM TARTRATE 10 MG PO TABS      take 1 tablet by mouth at bedtime if needed for sleep   30 tablet   5     BP 183/55  Pulse 74  Temp 97.8 F (36.6 C) (Oral)  Resp 16  SpO2 95%  Physical Exam  Nursing note and vitals reviewed. Constitutional: She is oriented to person, place, and time. She appears well-developed and well-nourished.  HENT:  Head: Normocephalic and atraumatic.  Nose:  Nose normal.  Mouth/Throat: Oropharynx is clear and moist.  Eyes: Conjunctivae normal and EOM are normal. Pupils are equal, round, and reactive to light.  Neck: Normal range of motion. Neck supple. No JVD present. No tracheal deviation present. No thyromegaly present.  Cardiovascular: Normal rate, regular rhythm, normal heart sounds and intact distal pulses.  Exam reveals no gallop and no friction rub.   No murmur heard. Pulmonary/Chest: Effort normal and breath sounds normal. No stridor. No respiratory distress. She has no wheezes. She has no rales. She exhibits no tenderness.       No cough no chest pain. She is diminished in the bases  Abdominal: Soft. Bowel sounds are normal. She exhibits no distension and no mass. There is no tenderness. There is no rebound and no guarding.  Musculoskeletal: Normal range of motion. She exhibits no edema and no tenderness.  Lymphadenopathy:    She has no cervical adenopathy.  Neurological: She is alert and oriented to person, place, and time.  Skin: Skin is warm and dry. No rash noted. No erythema. No pallor.  Psychiatric: She has a normal mood and affect. Her behavior is normal. Judgment and thought content normal.    ED Course  Procedures (including critical care time)  Labs Reviewed  BASIC METABOLIC PANEL - Abnormal; Notable for the following:    Sodium 134 (*)     Glucose, Bld 133 (*)     Creatinine, Ser 1.43 (*)     GFR calc non Af Amer 36 (*)     GFR calc Af Amer 42 (*)     All other components within normal limits   Dg Chest 2 View  02/15/2012  *RADIOLOGY REPORT*  Clinical Data: Hypertension. Shortness of breath.  CHEST - 2 VIEW  Comparison: PA and lateral chest 03/27/2003.  Findings: The patient has small bilateral pleural effusions, larger on the left.  There is patchy bilateral airspace disease appearing worst in the bases and greater on the left.  Heart size is upper normal.  IMPRESSION: Small bilateral pleural effusions and patchy  airspace disease  could be due to asymmetric edema or pneumonia.   Original Report Authenticated By: Holley Dexter, M.D.     Date: 02/15/2012  Rate: 71  Rhythm: normal sinus rhythm  QRS Axis: normal  Intervals: normal  ST/T Wave abnormalities: nonspecific T wave changes  Conduction Disutrbances:none  Narrative Interpretation:   Old EKG Reviewed: none available     1. Hypertension, poor control   2. Anxiety       MDM  72 year old female with transitory malignant hypertension. Patient is feeling better now. She denies any shortness of breath currently or chest pain. Chest x-ray shows small bilateral pleural effusions do not feel this is likely a pneumonia, suspect asymmetric edema. Discussed with caretaker use of additional hydralazine when necessary for anxiety and hypertension. Will have her follow up with cardiology for further management of her blood pressure as it is not controlled well with 3 agents at this time.        Olivia Mackie, MD 02/15/12 [phone]

## 2012-02-16 ENCOUNTER — Telehealth: Payer: Self-pay | Admitting: Internal Medicine

## 2012-02-16 ENCOUNTER — Other Ambulatory Visit: Payer: Self-pay | Admitting: Internal Medicine

## 2012-02-16 NOTE — Telephone Encounter (Signed)
Steward Drone advised that it will be tomorrow before dr Lovell Sheehan can advise on message

## 2012-02-16 NOTE — Telephone Encounter (Signed)
Brenda/caregiver calling about anxiety.  States currently taking xanax 0.25mg  BID, but this does not seem to be enough for her.  States most recently she has increasing panic attacks, daily for past 10 days or so.  States they occur at about 2300 nightly, before she has gone to bed.  Would like Dr. Lovell Sheehan to consider increasing her dose if possible.  No recent changes in her prescriptions except for her hospitalization in December 2013 for TIA, where her hydralyzine 25mg  was increased from 1 BID to 2 BID.  Declines new triage; would like to consider increasing her xanax or adding something for her.  Info to office for provider review/callback.  May reach caller [phone]; may speak with Renae Fickle if Steward Drone is away.  krs/can

## 2012-02-17 ENCOUNTER — Other Ambulatory Visit: Payer: Self-pay | Admitting: *Deleted

## 2012-02-17 NOTE — Telephone Encounter (Signed)
Per dr Lovell Sheehan- increase xanax to .50 bid--brenda informed

## 2012-02-18 ENCOUNTER — Encounter (HOSPITAL_COMMUNITY): Payer: Self-pay | Admitting: Cardiology

## 2012-02-18 ENCOUNTER — Ambulatory Visit (HOSPITAL_COMMUNITY)
Admission: RE | Admit: 2012-02-18 | Discharge: 2012-02-18 | Disposition: A | Discharge status: Home / Self Care | Payer: Medicare Other | Source: Ambulatory Visit | Attending: Cardiology | Admitting: Cardiology

## 2012-02-18 ENCOUNTER — Encounter (HOSPITAL_COMMUNITY)
Admission: RE | Disposition: A | Discharge status: Home / Self Care | Payer: Self-pay | Source: Ambulatory Visit | Attending: Cardiology

## 2012-02-18 DIAGNOSIS — I639 Cerebral infarction, unspecified: Secondary | ICD-10-CM

## 2012-02-18 DIAGNOSIS — Z8673 Personal history of transient ischemic attack (TIA), and cerebral infarction without residual deficits: Secondary | ICD-10-CM | POA: Insufficient documentation

## 2012-02-18 DIAGNOSIS — I6789 Other cerebrovascular disease: Secondary | ICD-10-CM

## 2012-02-18 DIAGNOSIS — I059 Rheumatic mitral valve disease, unspecified: Secondary | ICD-10-CM | POA: Insufficient documentation

## 2012-02-18 HISTORY — PX: TEE WITHOUT CARDIOVERSION: SHX5443

## 2012-02-18 SURGERY — ECHOCARDIOGRAM, TRANSESOPHAGEAL
Anesthesia: Moderate Sedation

## 2012-02-18 MED ORDER — BUTAMBEN-TETRACAINE-BENZOCAINE 2-2-14 % EX AERO
INHALATION_SPRAY | CUTANEOUS | Status: DC | PRN
  Administered 2012-02-18: 2 via TOPICAL

## 2012-02-18 MED ORDER — MIDAZOLAM HCL 5 MG/ML IJ SOLN
INTRAMUSCULAR | Status: AC
  Filled 2012-02-18: qty 2

## 2012-02-18 MED ORDER — FENTANYL CITRATE 0.05 MG/ML IJ SOLN
INTRAMUSCULAR | Status: DC | PRN
  Administered 2012-02-18 (×3): 25 ug via INTRAVENOUS

## 2012-02-18 MED ORDER — FENTANYL CITRATE 0.05 MG/ML IJ SOLN
INTRAMUSCULAR | Status: AC
  Filled 2012-02-18: qty 2

## 2012-02-18 MED ORDER — MIDAZOLAM HCL 10 MG/2ML IJ SOLN
INTRAMUSCULAR | Status: DC | PRN
  Administered 2012-02-18 (×2): 2 mg via INTRAVENOUS

## 2012-02-18 MED ORDER — SODIUM CHLORIDE 0.9 % IV SOLN
INTRAVENOUS | Status: DC
Start: 2012-02-18 — End: 2012-02-18

## 2012-02-18 NOTE — H&P (Signed)
Patient Information       Patient Name Sex DOB SSN    Taisia, Fantini Female [birth year] [id number]       D/C Summaries signed by Cathren Harsh, MD at 02/03/12 1154     Author: Cathren Harsh, MD Service: Internal Medicine Author Type: Physician    Filed: 02/03/12 1154 Note Time: 02/03/12 1104           Physician Discharge Summary    Patient ID: MAHOGANY TORRANCE MRN: [id number] DOB/AGE: 05-08-1940 72 y.o.   Admit date: 02/01/2012 Discharge date: 02/03/2012   Primary Care Physician:  Carrie Mew, MD   Discharge Diagnoses:    . HYPERTENSION, MALIGNANT ESSENTIAL . acute right cerebellar CVA (cerebral infarction) . HYPERLIPIDEMIA . GERD   Consults: Neurology/stroke service     Discharge Medications:    Medication List        As of 02/03/2012 11:04 AM      STOP taking these medications              Olmesartan-Amlodipine-HCTZ 40-10-25 MG Tabs          TAKE these medications              ALPRAZolam 0.25 MG tablet     Commonly known as: XANAX     take 1 tablet by mouth twice a day           amLODipine 10 MG tablet     Commonly known as: NORVASC     Take 1 tablet (10 mg total) by mouth daily.           carvedilol 25 MG tablet     Commonly known as: COREG     TAKE 1 TABLET TWICE DAILY           cloNIDine 0.2 MG tablet     Commonly known as: CATAPRES     TAKE 1 TABLET THREE TIMES A DAY           clopidogrel 75 MG tablet     Commonly known as: PLAVIX     Take 75 mg by mouth daily.           cyclobenzaprine 10 MG tablet     Commonly known as: FLEXERIL     Take 10 mg by mouth at bedtime as needed. Muscle spasms           escitalopram 10 MG tablet     Commonly known as: LEXAPRO     TAKE 1 TABLET BY MOUTH ONCE DAILY           esomeprazole 40 MG capsule     Commonly known as: NEXIUM     Take 40 mg by mouth daily as needed. Acid reflux           hydrALAZINE 25 MG tablet     Commonly known as: APRESOLINE     Take 1 tablet (25 mg  total) by mouth 2 (two) times daily.           rosuvastatin 20 MG tablet     Commonly known as: CRESTOR     Take 20 mg by mouth once a week. At night on fridays           tolterodine 4 MG 24 hr capsule     Commonly known as: DETROL LA     Take 1 capsule (4 mg total) by mouth daily. At  6  PM  zolpidem 10 MG tablet     Commonly known as: AMBIEN     take 1 tablet by mouth at bedtime if needed for sleep                 Brief H and P: For complete details please refer to admission H and P, but in brief the patient is a 72 year old female who presented with headache, slurred speech and difficulty getting out of the bed at 11 AM a on the morning of admission when she woke up with questionable new left-sided weakness as well. Her symptoms had rapidly resolved after arrival to the ED. Patient was admitted for further workup. She previously had a large left-sided MCA stroke in the past which had left her mostly paralyzed on her right side of the body at baseline.   Hospital Course:  Acute right cerebellar CVA (cerebral infarction); right brain TIA led to left hemiparesis, now resolved. Etiology of stroke and TIA in 2 different distrubution leads up to suspect an embolic etiology. Neurology recommended out-patient w/u for embolic source. MRI showed tiny nonhemorrhagic right cerebellar acute infarct. 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 2-D echo showed EF of 55-65%, grade 3 diastolic dysfunction, no PFO. Carotid Dopplers showed occluded left internal carotid artery, vertebral flow antegrade. Continue Plavix , BP control, statin.    EEG was also done which showed abnormal secondary to left hemispheric slowing and mid temporal sharp transients consistent with previous history of stroke 13 years ago.    I contacted [facility] to arrange for Holter monitor and TEE out-patient, the office will open tomorrow and they will contact the patient to arrange, patient information  given to [facility], [facility].  Marland Kitchen    HYPERLIPIDEMIA: Placed on statin    HYPERTENSION, MALIGNANT ESSENTIAL: BP 233/77 on arrival. As patient's creatinine was 1.7 at the time of admission, the combination of Norvasc/ARB/HCTZ was discontinued. Patient was continued on beta blocker, clonidine, Norvasc was added and hydralazine. She should have repeat followup with her PCP within next 7-10 days to adjust her medications.  Cr is 1.3 at DC.   GERD: Continue PPI     Day of Discharge BP 151/47  Pulse 58  Temp 98.4 F (36.9 C) (Oral)  Resp 18  Ht 5\' 9"  (1.753 m)  Wt 74.9 kg (165 lb 2 oz)  BMI 24.38 kg/m2  SpO2 98%   Physical Exam: General: Alert and awake oriented x3 not in any acute distress. HEENT: anicteric sclera, pupils reactive to light and accommodation CVS: S1-S2 clear no murmur rubs or gallops Chest: clear to auscultation bilaterally, no wheezing rales or rhonchi Abdomen: soft nontender, nondistended, normal bowel sounds, no organomegaly Extremities: no cyanosis, clubbing or edema noted bilaterally Neuro: Chronic right-sided hemiparesis    The results of significant diagnostics from this hospitalization (including imaging, microbiology, ancillary and laboratory) are listed below for reference.       LAB RESULTS: Basic Metabolic Panel:   Lab  02/02/12 0650  02/01/12 1505   NA  --  142   K  --  4.4   CL  --  106   CO2  --  27   GLUCOSE  --  91   BUN  --  29*   CREATININE  1.38*  1.70*   CALCIUM  --  10.6*   MG  --  --   PHOS  --  --      Liver Function Tests:   Lab  02/01/12 1505  AST  13   ALT  9   ALKPHOS  156*   BILITOT  0.3   PROT  7.7   ALBUMIN  4.1      CBC:   Lab  02/02/12 0650  February 22, 2012 1505   WBC  7.6  5.7   NEUTROABS  --  4.1   HGB  13.6  13.6   HCT  38.4  39.3   MCV  93.2  --   PLT  190  197      CBG:   Lab  02-22-2012 1506   GLUCAP  88        Significant Diagnostic Studies:   Ct Head Wo Contrast   February 22, 2012   *RADIOLOGY REPORT*  Clinical Data: Spasms  CT HEAD WITHOUT CONTRAST  Technique:  Contiguous axial images were obtained from the base of the skull through the vertex without contrast.  Comparison: CT 09/22/2008  Findings: Chronic left MCA infarct is unchanged.  There is volume loss in the left with dilatation of the left lateral ventricle.  No acute infarct.  Negative for hemorrhage or mass lesion.  Mild chronic microvascular ischemia in the cerebral white matter on the right.  Negative for hemorrhage or mass.  Calvarium is intact.  IMPRESSION: Chronic left MCA infarct.  No acute abnormality.   Original Report Authenticated By: Janeece Riggers, M.D.         Disposition and Follow-up:       Discharge Orders      Future Appointments:  Provider:  Department:  Dept Phone:  Center:     03/28/2012 3:45 PM  Stacie Glaze, MD  [facility]  [phone]  [facility]        Future Orders  Please Complete By  Expires     Diet - low sodium heart healthy          Increase activity slowly                  DISPOSITION: Home   DIET: Heart healthy diet   ACTIVITY: As tolerated   DISCHARGE FOLLOW-UP Follow-up Information      Follow up with Carrie Mew, MD. Schedule an appointment as soon as possible for a visit in 10 days. (for hospital follow-up, adjust BP medications)      Contact information:     [address] Christena Flake [facility]  [place] [address] [phone]           Follow up with Gates Rigg, MD. Schedule an appointment as soon as possible for a visit in 2 months. (stroke follow-up)      Contact information:     [address], [address] [facility] [place] [address] [phone]                 Time spent on Discharge: 40 minutes   Signed:     RAI,RIPUDEEP M.D. [facility] 02/03/2012, 11:04 AM Pager: [phone]   Patient scheduled for outpatient TEE for recent CVA; no changes. Olga Millers

## 2012-02-18 NOTE — Interval H&P Note (Signed)
History and Physical Interval Note:  02/18/2012 11:05 AM  Lori Fox  has presented today for surgery, with the diagnosis of r/o clot, cva  The various methods of treatment have been discussed with the patient and family. After consideration of risks, benefits and other options for treatment, the patient has consented to  Procedure(s) (LRB) with comments: TRANSESOPHAGEAL ECHOCARDIOGRAM (TEE) (N/A) as a surgical intervention .  The patient's history has been reviewed, patient examined, no change in status, stable for surgery.  I have reviewed the patient's chart and labs.  Questions were answered to the patient's satisfaction.     Olga Millers

## 2012-02-18 NOTE — Progress Notes (Signed)
Echocardiogram Echocardiogram Transesophageal has been performed.  Karrisa Didio 02/18/2012, 12:26 PM

## 2012-02-18 NOTE — CV Procedure (Signed)
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 Lori Fox

## 2012-02-19 ENCOUNTER — Encounter (HOSPITAL_COMMUNITY): Payer: Self-pay | Admitting: Cardiology

## 2012-02-26 ENCOUNTER — Telehealth: Payer: Self-pay | Admitting: Internal Medicine

## 2012-02-26 NOTE — Telephone Encounter (Signed)
Per dr Jarrett Soho let them know he cannot transfer to [facility]

## 2012-02-26 NOTE — Telephone Encounter (Signed)
Called Steward Drone back to remind her of appt on Monday in case Arrielle is DCd. She states that she and her husband have taken care of Baylyn 24hrs a day for 9 years - and she can tell something is wrong with her. She wants to know if Dr. Lovell Sheehan can have Kathie Rhodes transferred to [facility]. Please advise.

## 2012-02-26 NOTE — Telephone Encounter (Signed)
Called pt's home to reschedule post hosp appt on 1/20. Spoke w/ Lori Fox, who takes care of Lori Fox is her Healthcare POA. She was unaware of appt and stated pt was still in hospital at [facility]. She felt she should tell our office what's been going on: 12/23 - admitted to [facility] and was given dx of stroke. Also told Lori Fox & her husband that Lori Fox had a blockage on the R side of her neck, but that it was not severe and not to worry. D/Cd on 12/25. Saw Lori Fox for f/u and had elev BP so med was changed. Patient then began having panic-type attacks at home, with SOB, panting, sweating. Lori Fox called 911 on 12/29. Took pt's BP before EMS arrived = 248/102. States that when EMS arrived & took BP, it was 230/30. Was taken to [facility] ED & given IV & O2 then D/Cd home. (On either 12/25 or 12/29, pt was sent home by Dr. Myrtis Fox with a heart monitor to wear for 30 days.) Panic attacks continued at home, increasing to TID some days. On 1/9 pt went to see Dr. Jens Fox and had a "T.E.E." - dx: clot, CVA - was told again that it was not severe & not to worry.  On 1/10, pt again began having panic-type attack w/SOB, sweating, hardly able to breathe. They didn't want to call 911 again, & they live right next to [facility], so they took Lori Fox there where she was admitted.  She has been there ever since with dx of CHF, pneumonia, & told the L side of her heart is leaking. POA Lori Fox states that no cardiologist has been in to see Lori Fox, though Lori Fox has asked about. They told [facility] about the neck blockage, but [facility] has not mentioned it or addressed it. Lori Fox states they have her on O2 & IV, and some meds that make her sleep all the time and knock her out - very groggy. She states that when she calls and goes up there, [facility] will not discuss anything about Lori Fox with her. She states they told her as long as Lori Fox knows the day of the week, Lori Fox would have to have a court order declaring Lori Fox incompetent before they can tell her  anything, & they will not return her calls. They talk daily about discharging Lori Fox, but then she has another panic attack and they don't do it. Lori Fox states pt had a CT yesterday but knows nothing about results. The doctor keeps saying that they are going to DC her to rehab for a couple of weeks, but so far she is still at [facility]. Per Lori Fox "messes" her bed at the hospital, seems drugged up all the time, sleeps hours at a time. Apparently Lori Fox thought it was her Xanax that was making her do this, so she refused it at [facility] and they stopped giving it to her. When Lori Fox mentioned that the pt has been on it for years and probably shouldn't just stop taking it, she states the nurse said "I can't shove it down her throat". That was 3 days ago. POA is very concerned and doesn't know what to do.

## 2012-02-26 NOTE — Telephone Encounter (Signed)
Talked with brenda care giver and told her this an d she is upset that pt is going to rehab, so explained that rehab is needed now and she can probably go back with her when she is better

## 2012-02-29 ENCOUNTER — Inpatient Hospital Stay (HOSPITAL_COMMUNITY)
Admission: EM | Admit: 2012-02-29 | Discharge: 2012-03-04 | DRG: 291 | Disposition: A | Discharge status: Skilled Nursing Facility | Payer: Medicare Other | Attending: Internal Medicine | Admitting: Internal Medicine

## 2012-02-29 ENCOUNTER — Emergency Department (HOSPITAL_COMMUNITY): Payer: Medicare Other

## 2012-02-29 ENCOUNTER — Ambulatory Visit: Payer: Medicare Other | Admitting: Family Medicine

## 2012-02-29 ENCOUNTER — Ambulatory Visit: Payer: Medicare Other | Admitting: Internal Medicine

## 2012-02-29 ENCOUNTER — Encounter (HOSPITAL_COMMUNITY): Payer: Self-pay | Admitting: *Deleted

## 2012-02-29 DIAGNOSIS — I639 Cerebral infarction, unspecified: Secondary | ICD-10-CM | POA: Diagnosis present

## 2012-02-29 DIAGNOSIS — I5033 Acute on chronic diastolic (congestive) heart failure: Principal | ICD-10-CM | POA: Diagnosis present

## 2012-02-29 DIAGNOSIS — K219 Gastro-esophageal reflux disease without esophagitis: Secondary | ICD-10-CM | POA: Diagnosis present

## 2012-02-29 DIAGNOSIS — J9600 Acute respiratory failure, unspecified whether with hypoxia or hypercapnia: Secondary | ICD-10-CM | POA: Diagnosis present

## 2012-02-29 DIAGNOSIS — I498 Other specified cardiac arrhythmias: Secondary | ICD-10-CM | POA: Diagnosis not present

## 2012-02-29 DIAGNOSIS — Z7982 Long term (current) use of aspirin: Secondary | ICD-10-CM

## 2012-02-29 DIAGNOSIS — E876 Hypokalemia: Secondary | ICD-10-CM | POA: Diagnosis present

## 2012-02-29 DIAGNOSIS — F09 Unspecified mental disorder due to known physiological condition: Secondary | ICD-10-CM

## 2012-02-29 DIAGNOSIS — R0902 Hypoxemia: Secondary | ICD-10-CM

## 2012-02-29 DIAGNOSIS — I129 Hypertensive chronic kidney disease with stage 1 through stage 4 chronic kidney disease, or unspecified chronic kidney disease: Secondary | ICD-10-CM | POA: Diagnosis present

## 2012-02-29 DIAGNOSIS — I509 Heart failure, unspecified: Secondary | ICD-10-CM | POA: Diagnosis present

## 2012-02-29 DIAGNOSIS — I739 Peripheral vascular disease, unspecified: Secondary | ICD-10-CM | POA: Diagnosis present

## 2012-02-29 DIAGNOSIS — F411 Generalized anxiety disorder: Secondary | ICD-10-CM | POA: Diagnosis present

## 2012-02-29 DIAGNOSIS — Z79899 Other long term (current) drug therapy: Secondary | ICD-10-CM

## 2012-02-29 DIAGNOSIS — I10 Essential (primary) hypertension: Secondary | ICD-10-CM | POA: Diagnosis present

## 2012-02-29 DIAGNOSIS — I6990 Unspecified sequelae of unspecified cerebrovascular disease: Secondary | ICD-10-CM

## 2012-02-29 DIAGNOSIS — F0390 Unspecified dementia without behavioral disturbance: Secondary | ICD-10-CM | POA: Diagnosis present

## 2012-02-29 DIAGNOSIS — Z87891 Personal history of nicotine dependence: Secondary | ICD-10-CM

## 2012-02-29 DIAGNOSIS — E785 Hyperlipidemia, unspecified: Secondary | ICD-10-CM

## 2012-02-29 DIAGNOSIS — N183 Chronic kidney disease, stage 3 unspecified: Secondary | ICD-10-CM | POA: Diagnosis present

## 2012-02-29 DIAGNOSIS — Z7902 Long term (current) use of antithrombotics/antiplatelets: Secondary | ICD-10-CM

## 2012-02-29 DIAGNOSIS — M129 Arthropathy, unspecified: Secondary | ICD-10-CM | POA: Diagnosis present

## 2012-02-29 DIAGNOSIS — H5440 Blindness, one eye, unspecified eye: Secondary | ICD-10-CM | POA: Diagnosis present

## 2012-02-29 DIAGNOSIS — T465X5A Adverse effect of other antihypertensive drugs, initial encounter: Secondary | ICD-10-CM | POA: Diagnosis not present

## 2012-02-29 DIAGNOSIS — D649 Anemia, unspecified: Secondary | ICD-10-CM | POA: Diagnosis present

## 2012-02-29 DIAGNOSIS — I69959 Hemiplegia and hemiparesis following unspecified cerebrovascular disease affecting unspecified side: Secondary | ICD-10-CM

## 2012-02-29 DIAGNOSIS — Y9210 Unspecified residential institution as the place of occurrence of the external cause: Secondary | ICD-10-CM | POA: Diagnosis not present

## 2012-02-29 DIAGNOSIS — J309 Allergic rhinitis, unspecified: Secondary | ICD-10-CM

## 2012-02-29 LAB — CBC WITH DIFFERENTIAL/PLATELET
Basophils Absolute: 0 10*3/uL (ref 0.0–0.1)
Basophils Relative: 0 % (ref 0–1)
Eosinophils Absolute: 0 10*3/uL (ref 0.0–0.7)
MCH: 32 pg (ref 26.0–34.0)
MCHC: 34.6 g/dL (ref 30.0–36.0)
Monocytes Relative: 3 % (ref 3–12)
Neutrophils Relative %: 93 % — ABNORMAL HIGH (ref 43–77)
Platelets: 269 10*3/uL (ref 150–400)
RDW: 12.8 % (ref 11.5–15.5)

## 2012-02-29 LAB — URINALYSIS, ROUTINE W REFLEX MICROSCOPIC
Glucose, UA: NEGATIVE mg/dL
Ketones, ur: NEGATIVE mg/dL
Leukocytes, UA: NEGATIVE
pH: 5 (ref 5.0–8.0)

## 2012-02-29 LAB — PRO B NATRIURETIC PEPTIDE: Pro B Natriuretic peptide (BNP): 7880 pg/mL — ABNORMAL HIGH (ref 0–125)

## 2012-02-29 LAB — COMPREHENSIVE METABOLIC PANEL
AST: 14 U/L (ref 0–37)
Albumin: 3.7 g/dL (ref 3.5–5.2)
Alkaline Phosphatase: 138 U/L — ABNORMAL HIGH (ref 39–117)
BUN: 19 mg/dL (ref 6–23)
Potassium: 3.3 mEq/L — ABNORMAL LOW (ref 3.5–5.1)
Sodium: 138 mEq/L (ref 135–145)
Total Protein: 7.6 g/dL (ref 6.0–8.3)

## 2012-02-29 LAB — POCT I-STAT TROPONIN I: Troponin i, poc: 0.02 ng/mL (ref 0.00–0.08)

## 2012-02-29 LAB — D-DIMER, QUANTITATIVE: D-Dimer, Quant: 2.89 ug/mL-FEU — ABNORMAL HIGH (ref 0.00–0.48)

## 2012-02-29 MED ORDER — ZOLPIDEM TARTRATE 5 MG PO TABS: 5.0000 mg | ORAL_TABLET | Freq: Every evening | ORAL | Status: DC | PRN

## 2012-02-29 MED ORDER — ONDANSETRON HCL 4 MG PO TABS: 4.0000 mg | ORAL_TABLET | Freq: Four times a day (QID) | ORAL | Status: DC | PRN

## 2012-02-29 MED ORDER — CLOPIDOGREL BISULFATE 75 MG PO TABS
75.0000 mg | ORAL_TABLET | Freq: Every day | ORAL | Status: DC
  Administered 2012-03-01 – 2012-03-04 (×4): 75 mg via ORAL
  Filled 2012-02-29 (×4): qty 1

## 2012-02-29 MED ORDER — SODIUM CHLORIDE 0.9 % IJ SOLN: 3.0000 mL | Freq: Two times a day (BID) | INTRAMUSCULAR | Status: DC

## 2012-02-29 MED ORDER — SODIUM CHLORIDE 0.9 % IJ SOLN
3.0000 mL | Freq: Two times a day (BID) | INTRAMUSCULAR | Status: DC
  Administered 2012-02-29 – 2012-03-04 (×8): 3 mL via INTRAVENOUS

## 2012-02-29 MED ORDER — PANTOPRAZOLE SODIUM 40 MG PO TBEC
80.0000 mg | DELAYED_RELEASE_TABLET | Freq: Every day | ORAL | Status: DC
  Administered 2012-03-01: 80 mg via ORAL
  Filled 2012-02-29: qty 2

## 2012-02-29 MED ORDER — ALUM & MAG HYDROXIDE-SIMETH 200-200-20 MG/5ML PO SUSP: 30.0000 mL | Freq: Four times a day (QID) | ORAL | Status: DC | PRN

## 2012-02-29 MED ORDER — ENOXAPARIN SODIUM 40 MG/0.4ML ~~LOC~~ SOLN
40.0000 mg | SUBCUTANEOUS | Status: DC
  Administered 2012-02-29 – 2012-03-01 (×2): 40 mg via SUBCUTANEOUS
  Filled 2012-02-29 (×3): qty 0.4

## 2012-02-29 MED ORDER — POTASSIUM CHLORIDE CRYS ER 20 MEQ PO TBCR
40.0000 meq | EXTENDED_RELEASE_TABLET | Freq: Once | ORAL | Status: AC
  Administered 2012-02-29: 40 meq via ORAL
  Filled 2012-02-29: qty 2

## 2012-02-29 MED ORDER — ALBUTEROL SULFATE (5 MG/ML) 0.5% IN NEBU: 2.5000 mg | INHALATION_SOLUTION | RESPIRATORY_TRACT | Status: DC | PRN

## 2012-02-29 MED ORDER — HYDRALAZINE HCL 50 MG PO TABS
50.0000 mg | ORAL_TABLET | Freq: Three times a day (TID) | ORAL | Status: DC
  Administered 2012-02-29 – 2012-03-02 (×5): 50 mg via ORAL
  Filled 2012-02-29 (×8): qty 1

## 2012-02-29 MED ORDER — CLONIDINE HCL 0.1 MG PO TABS
0.1000 mg | ORAL_TABLET | Freq: Once | ORAL | Status: AC
  Administered 2012-02-29: 0.1 mg via ORAL
  Filled 2012-02-29: qty 1

## 2012-02-29 MED ORDER — SODIUM CHLORIDE 0.9 % IV SOLN: 250.0000 mL | INTRAVENOUS | Status: DC | PRN

## 2012-02-29 MED ORDER — ACETAMINOPHEN 325 MG PO TABS: 650.0000 mg | ORAL_TABLET | Freq: Four times a day (QID) | ORAL | Status: DC | PRN

## 2012-02-29 MED ORDER — ALPRAZOLAM 0.5 MG PO TABS
0.5000 mg | ORAL_TABLET | Freq: Two times a day (BID) | ORAL | Status: DC
  Administered 2012-02-29 – 2012-03-04 (×8): 0.5 mg via ORAL
  Filled 2012-02-29 (×8): qty 1

## 2012-02-29 MED ORDER — AMLODIPINE BESYLATE 10 MG PO TABS
10.0000 mg | ORAL_TABLET | Freq: Every day | ORAL | Status: DC
  Administered 2012-03-01 – 2012-03-02 (×2): 10 mg via ORAL
  Filled 2012-02-29 (×3): qty 1

## 2012-02-29 MED ORDER — FUROSEMIDE 10 MG/ML IJ SOLN
40.0000 mg | Freq: Four times a day (QID) | INTRAMUSCULAR | Status: DC
  Administered 2012-02-29 – 2012-03-01 (×3): 40 mg via INTRAVENOUS
  Filled 2012-02-29 (×5): qty 4

## 2012-02-29 MED ORDER — CARVEDILOL 25 MG PO TABS
25.0000 mg | ORAL_TABLET | Freq: Two times a day (BID) | ORAL | Status: DC
  Administered 2012-03-01 – 2012-03-03 (×5): 25 mg via ORAL
  Filled 2012-02-29 (×9): qty 1

## 2012-02-29 MED ORDER — ACETAMINOPHEN 650 MG RE SUPP: 650.0000 mg | Freq: Four times a day (QID) | RECTAL | Status: DC | PRN

## 2012-02-29 MED ORDER — ONDANSETRON HCL 4 MG/2ML IJ SOLN: 4.0000 mg | Freq: Four times a day (QID) | INTRAMUSCULAR | Status: DC | PRN

## 2012-02-29 MED ORDER — SODIUM CHLORIDE 0.9 % IJ SOLN: 3.0000 mL | INTRAMUSCULAR | Status: DC | PRN

## 2012-02-29 MED ORDER — ESCITALOPRAM OXALATE 10 MG PO TABS
10.0000 mg | ORAL_TABLET | Freq: Every day | ORAL | Status: DC
  Administered 2012-03-01 – 2012-03-04 (×4): 10 mg via ORAL
  Filled 2012-02-29 (×4): qty 1

## 2012-02-29 MED ORDER — ATORVASTATIN CALCIUM 10 MG PO TABS
10.0000 mg | ORAL_TABLET | Freq: Every day | ORAL | Status: DC
  Administered 2012-03-01 – 2012-03-03 (×3): 10 mg via ORAL
  Filled 2012-02-29 (×4): qty 1

## 2012-02-29 MED ORDER — FUROSEMIDE 10 MG/ML IJ SOLN
20.0000 mg | Freq: Once | INTRAMUSCULAR | Status: AC
  Administered 2012-02-29: 20 mg via INTRAVENOUS
  Filled 2012-02-29: qty 2

## 2012-02-29 MED ORDER — ASPIRIN EC 81 MG PO TBEC
81.0000 mg | DELAYED_RELEASE_TABLET | Freq: Every day | ORAL | Status: DC
  Administered 2012-02-29 – 2012-03-04 (×5): 81 mg via ORAL
  Filled 2012-02-29 (×6): qty 1

## 2012-02-29 NOTE — ED Notes (Signed)
Patient transported to X-ray 

## 2012-02-29 NOTE — ED Provider Notes (Signed)
History     CSN: [id number]  Arrival date & time 02/29/12  1305   First MD Initiated Contact with Patient 02/29/12 1327      Chief Complaint  Patient presents with  . Fatigue    (Consider location/radiation/quality/duration/timing/severity/associated sxs/prior treatment) HPI Comments: Patient presents with vomiting diarrhea and fatigue. She was recently admitted at the end of December for a CVA with resulting right-sided weakness. She was suddenly admitted about one week ago to [facility] for congestive heart failure and pneumonia. She was discharged 2 days ago. Her family who takes care of her at home says that she's had a decline in that she can't get around well on her own. She's had some increased shortness of breath. She denies any chest pain or tightness. She states that her right-sided weakness is at baseline. She feels overall fatigued and her blood pressures been elevated. She denies any headache or dizziness. She had one episode of vomiting this morning but hasn't had any since then. She had a large amount of diarrhea that started while she was out [facility] but currently she is only having about 2 stools a day. She gets short of breath with little exertion. Her family is wanting her to be admitted 4 physical therapy and possible admission to a nursing home or rehabilitation facility. They feel that she's not doing well at home.   Past Medical History  Diagnosis Date  . Hyperlipidemia   . Stroke   . Hypertension   . Arthritis   . PVD (peripheral vascular disease)   . Blindness of left eye   . Allergy   . GERD (gastroesophageal reflux disease)     Past Surgical History  Procedure Date  . Carotid endarterectomy   . Foot surgery rt metatarsal   . Rt knee arthroscopic   . Tee without cardioversion 02/18/2012    Procedure: TRANSESOPHAGEAL ECHOCARDIOGRAM (TEE);  Surgeon: Lewayne Bunting, MD;  Location: [facility] ENDOSCOPY;  Service: Cardiovascular;  Laterality: N/A;     Family History  Problem Relation Age of Onset  . Cancer Father     head and neck  . Alzheimer's disease Mother     History  Substance Use Topics  . Smoking status: Former Games developer  . Smokeless tobacco: Not on file  . Alcohol Use: No    OB History    Grav Para Term Preterm Abortions TAB SAB Ect Mult Living                  Review of Systems  Constitutional: Positive for fatigue. Negative for fever, chills and diaphoresis.  HENT: Negative for congestion, rhinorrhea and sneezing.   Eyes: Negative.   Respiratory: Negative for cough, chest tightness and shortness of breath.   Cardiovascular: Negative for chest pain and leg swelling.  Gastrointestinal: Positive for nausea, vomiting and diarrhea. Negative for abdominal pain and blood in stool.  Genitourinary: Negative for frequency, hematuria, flank pain and difficulty urinating.  Musculoskeletal: Negative for back pain and arthralgias.  Skin: Negative for rash.  Neurological: Positive for weakness (generalized). Negative for dizziness, speech difficulty, numbness and headaches.    Allergies  Codeine sulfate  Home Medications   Current Outpatient Rx  Name  Route  Sig  Dispense  Refill  . ALPRAZOLAM 0.5 MG PO TABS   Oral   Take 0.5 mg by mouth 2 (two) times daily.         Marland Kitchen AMLODIPINE BESYLATE 10 MG PO TABS   Oral  Take 1 tablet (10 mg total) by mouth daily.   60 tablet   3   . ASPIRIN 81 MG PO CHEW   Oral   Chew 362 mg by mouth once.         Marland Kitchen CARVEDILOL 25 MG PO TABS      TAKE 1 TABLET TWICE DAILY   60 tablet   3   . CLONIDINE HCL 0.2 MG PO TABS               . CLOPIDOGREL BISULFATE 75 MG PO TABS   Oral   Take 75 mg by mouth daily.         . CYCLOBENZAPRINE HCL 10 MG PO TABS   Oral   Take 10 mg by mouth at bedtime as needed. Muscle spasms         . ESCITALOPRAM OXALATE 10 MG PO TABS      TAKE 1 TABLET BY MOUTH ONCE DAILY   30 tablet   5     Refill Approved   . ESOMEPRAZOLE  MAGNESIUM 40 MG PO CPDR   Oral   Take 40 mg by mouth daily as needed. Acid reflux         . HYDRALAZINE HCL 50 MG PO TABS   Oral   Take 1 tablet (50 mg total) by mouth 2 (two) times daily.   60 tablet   3   . ROSUVASTATIN CALCIUM 20 MG PO TABS   Oral   Take 20 mg by mouth once a week. At night on fridays         . SOLIFENACIN SUCCINATE 5 MG PO TABS   Oral   Take 2 tablets (10 mg total) by mouth daily.   30 tablet   3   . ZOLPIDEM TARTRATE 10 MG PO TABS      take 1 tablet by mouth at bedtime if needed for sleep   30 tablet   5     BP 207/54  Pulse 86  Temp 97.4 F (36.3 C) (Oral)  Resp 22  SpO2 94%  Physical Exam  Constitutional: She is oriented to person, place, and time. She appears well-developed and well-nourished.  HENT:  Head: Normocephalic and atraumatic.  Mouth/Throat: Oropharynx is clear and moist.  Eyes: Pupils are equal, round, and reactive to light.  Neck: Normal range of motion. Neck supple.  Cardiovascular: Normal rate, regular rhythm and normal heart sounds.   Pulmonary/Chest: Effort normal and breath sounds normal. No respiratory distress. She has no wheezes. She has no rales. She exhibits no tenderness.  Abdominal: Soft. Bowel sounds are normal. There is no tenderness. There is no rebound and no guarding.  Musculoskeletal: Normal range of motion. She exhibits no edema.  Lymphadenopathy:    She has no cervical adenopathy.  Neurological: She is alert and oriented to person, place, and time.  Skin: Skin is warm and dry. No rash noted.  Psychiatric: She has a normal mood and affect.    ED Course  Procedures (including critical care time)  Results for orders placed during the hospital encounter of 02/29/12  CBC WITH DIFFERENTIAL      Component Value Range   WBC 10.0  4.0 - 10.5 K/uL   RBC 3.31 (*) 3.87 - 5.11 MIL/uL   Hemoglobin 10.6 (*) 12.0 - 15.0 g/dL   HCT 16.1 (*) 09.6 - 04.5 %   MCV 92.4  78.0 - 100.0 fL   MCH 32.0  26.0 - 34.0 pg  MCHC 34.6  30.0 - 36.0 g/dL   RDW 16.1  09.6 - 04.5 %   Platelets 269  150 - 400 K/uL   Neutrophils Relative 93 (*) 43 - 77 %   Neutro Abs 9.2 (*) 1.7 - 7.7 K/uL   Lymphocytes Relative 5 (*) 12 - 46 %   Lymphs Abs 0.5 (*) 0.7 - 4.0 K/uL   Monocytes Relative 3  3 - 12 %   Monocytes Absolute 0.3  0.1 - 1.0 K/uL   Eosinophils Relative 0  0 - 5 %   Eosinophils Absolute 0.0  0.0 - 0.7 K/uL   Basophils Relative 0  0 - 1 %   Basophils Absolute 0.0  0.0 - 0.1 K/uL  COMPREHENSIVE METABOLIC PANEL      Component Value Range   Sodium 138  135 - 145 mEq/L   Potassium 3.3 (*) 3.5 - 5.1 mEq/L   Chloride 100  96 - 112 mEq/L   CO2 23  19 - 32 mEq/L   Glucose, Bld 164 (*) 70 - 99 mg/dL   BUN 19  6 - 23 mg/dL   Creatinine, Ser 4.09 (*) 0.50 - 1.10 mg/dL   Calcium 81.1 (*) 8.4 - 10.5 mg/dL   Total Protein 7.6  6.0 - 8.3 g/dL   Albumin 3.7  3.5 - 5.2 g/dL   AST 14  0 - 37 U/L   ALT 12  0 - 35 U/L   Alkaline Phosphatase 138 (*) 39 - 117 U/L   Total Bilirubin 0.6  0.3 - 1.2 mg/dL   GFR calc non Af Amer 33 (*) >90 mL/min   GFR calc Af Amer 38 (*) >90 mL/min  URINALYSIS, ROUTINE W REFLEX MICROSCOPIC      Component Value Range   Color, Urine YELLOW  YELLOW   APPearance CLEAR  CLEAR   Specific Gravity, Urine 1.011  1.005 - 1.030   pH 5.0  5.0 - 8.0   Glucose, UA NEGATIVE  NEGATIVE mg/dL   Hgb urine dipstick NEGATIVE  NEGATIVE   Bilirubin Urine NEGATIVE  NEGATIVE   Ketones, ur NEGATIVE  NEGATIVE mg/dL   Protein, ur NEGATIVE  NEGATIVE mg/dL   Urobilinogen, UA 0.2  0.0 - 1.0 mg/dL   Nitrite NEGATIVE  NEGATIVE   Leukocytes, UA NEGATIVE  NEGATIVE  POCT I-STAT TROPONIN I      Component Value Range   Troponin i, poc 0.02  0.00 - 0.08 ng/mL   Comment 3            Dg Chest 2 View  02/15/2012  *RADIOLOGY REPORT*  Clinical Data: Hypertension. Shortness of breath.  CHEST - 2 VIEW  Comparison: PA and lateral chest 03/27/2003.  Findings: The patient has small bilateral pleural effusions, larger on the  left.  There is patchy bilateral airspace disease appearing worst in the bases and greater on the left.  Heart size is upper normal.  IMPRESSION: Small bilateral pleural effusions and patchy airspace disease could be due to asymmetric edema or pneumonia.   Original Report Authenticated By: Holley Dexter, M.D.    Ct Head Wo Contrast  02/01/2012  *RADIOLOGY REPORT*  Clinical Data: Spasms  CT HEAD WITHOUT CONTRAST  Technique:  Contiguous axial images were obtained from the base of the skull through the vertex without contrast.  Comparison: CT 09/22/2008  Findings: Chronic left MCA infarct is unchanged.  There is volume loss in the left with dilatation of the left lateral ventricle.  No acute infarct.  Negative  for hemorrhage or mass lesion.  Mild chronic microvascular ischemia in the cerebral white matter on the right.  Negative for hemorrhage or mass.  Calvarium is intact.  IMPRESSION: Chronic left MCA infarct.  No acute abnormality.   Original Report Authenticated By: Janeece Riggers, M.D.    Mr Brain Wo Contrast  02/02/2012  *RADIOLOGY REPORT*  Clinical Data:  Headache and slurred speech with questionable new left-sided weakness.  Right-sided weakness from remote stroke. Hypertensive hyperlipidemic patient.  Blindness left eye.  MRI BRAIN WITHOUT CONTRAST MRA HEAD WITHOUT CONTRAST  Technique: Multiplanar, multiecho pulse sequences of the brain and surrounding structures were obtained according to standard protocol without intravenous contrast.  Angiographic images of the head were obtained using MRA technique without contrast.  Comparison: 02/01/2012 head CT.  No comparison brain MR.  MRI HEAD  Findings:  Small rounded area of restricted motion right cerebellum.  In the present clinical setting this is most consistent with a tiny right cerebellar acute non hemorrhagic infarct rather than result of metastatic disease.  Remote large left hemispheric infarct involving left middle cerebral artery distribution  with large area of encephalomalacia and subsequent dilation of the left lateral ventricle.  The slight increased signal within the posterior aspect of the posterior limb of the right internal capsule extending to the right cerebral peduncle is felt to be a normal finding rather than acute infarct.  Moderate small vessel disease type changes.  Wallerian degeneration left cerebral peduncle and left pons.  Tiny remote infarcts cerebellum.  Abnormal appearance left internal carotid artery.  Please see below.  Atrophy without hydrocephalus.  No intracranial mass lesion detected on this unenhanced exam.  Minimal paranasal sinus mucosal thickening.  Minimal amount of blood breakdown products along the periphery of the remote large left hemispheric infarct otherwise no evidence of intracranial hemorrhage.  IMPRESSION: Tiny non hemorrhagic right cerebellar acute infarct.  Remote infarcts and small vessel disease type changes as discussed above.  MRA HEAD  Findings: Occluded left internal carotid artery.  Collateral flow with left carotid terminus visualized.  Left carotid terminus appears irregular and slightly narrowed.  Decreased number of visualized left middle cerebral artery branches consistent with the patient's remote infarct.  Moderate to marked focal narrowing A1-A2 junction of the left anterior cerebral artery.  Moderate narrowing supraclinoid aspect of the right internal carotid artery.  Left vertebral artery is dominant.  Right vertebral artery is of small caliber and irregular after the takeoff of the right PICA.  Mild narrowing portions of the right PICA.  No high-grade stenosis of the basilar artery.  Mild to moderate narrowing AICA greater on the right  Fetal type origin of the left posterior cerebral artery which is of decreased caliber throughout majority of its course.  Bulge at the right posterior communicating artery origin.  This may represent an infundibulum although a tiny aneurysm is not excluded.  Stability can be confirmed on follow-up.  IMPRESSION: Occluded left internal carotid artery.  Collateral flow with left carotid terminus visualized.  Left carotid terminus appears irregular and slightly narrowed.  Decreased number of visualized left middle cerebral artery branches consistent with the patient's remote infarct.  Moderate to marked focal narrowing A1-A2 junction of the left anterior cerebral artery.  Moderate narrowing supraclinoid aspect of the right internal carotid artery.  Left vertebral artery is dominant.  Right vertebral artery is of small caliber and irregular after the takeoff of the right PICA.  Mild narrowing portions of the right PICA.  Mild to moderate  narrowing AICA greater on the right  Fetal type origin of the left posterior cerebral artery which is of decreased caliber throughout majority of its course.  Bulge at the right posterior communicating artery origin.  This may represent an infundibulum although a tiny aneurysm is not excluded. Stability can be confirmed on follow-up.  This has been made a PRA call report utilizing dashboard call feature.   Original Report Authenticated By: Lacy Duverney, M.D.    Mr Mra Head/brain Wo Cm  02/02/2012  *RADIOLOGY REPORT*  Clinical Data:  Headache and slurred speech with questionable new left-sided weakness.  Right-sided weakness from remote stroke. Hypertensive hyperlipidemic patient.  Blindness left eye.  MRI BRAIN WITHOUT CONTRAST MRA HEAD WITHOUT CONTRAST  Technique: Multiplanar, multiecho pulse sequences of the brain and surrounding structures were obtained according to standard protocol without intravenous contrast.  Angiographic images of the head were obtained using MRA technique without contrast.  Comparison: 02/01/2012 head CT.  No comparison brain MR.  MRI HEAD  Findings:  Small rounded area of restricted motion right cerebellum.  In the present clinical setting this is most consistent with a tiny right cerebellar acute non  hemorrhagic infarct rather than result of metastatic disease.  Remote large left hemispheric infarct involving left middle cerebral artery distribution with large area of encephalomalacia and subsequent dilation of the left lateral ventricle.  The slight increased signal within the posterior aspect of the posterior limb of the right internal capsule extending to the right cerebral peduncle is felt to be a normal finding rather than acute infarct.  Moderate small vessel disease type changes.  Wallerian degeneration left cerebral peduncle and left pons.  Tiny remote infarcts cerebellum.  Abnormal appearance left internal carotid artery.  Please see below.  Atrophy without hydrocephalus.  No intracranial mass lesion detected on this unenhanced exam.  Minimal paranasal sinus mucosal thickening.  Minimal amount of blood breakdown products along the periphery of the remote large left hemispheric infarct otherwise no evidence of intracranial hemorrhage.  IMPRESSION: Tiny non hemorrhagic right cerebellar acute infarct.  Remote infarcts and small vessel disease type changes as discussed above.  MRA HEAD  Findings: Occluded left internal carotid artery.  Collateral flow with left carotid terminus visualized.  Left carotid terminus appears irregular and slightly narrowed.  Decreased number of visualized left middle cerebral artery branches consistent with the patient's remote infarct.  Moderate to marked focal narrowing A1-A2 junction of the left anterior cerebral artery.  Moderate narrowing supraclinoid aspect of the right internal carotid artery.  Left vertebral artery is dominant.  Right vertebral artery is of small caliber and irregular after the takeoff of the right PICA.  Mild narrowing portions of the right PICA.  No high-grade stenosis of the basilar artery.  Mild to moderate narrowing AICA greater on the right  Fetal type origin of the left posterior cerebral artery which is of decreased caliber throughout majority of  its course.  Bulge at the right posterior communicating artery origin.  This may represent an infundibulum although a tiny aneurysm is not excluded. Stability can be confirmed on follow-up.  IMPRESSION: Occluded left internal carotid artery.  Collateral flow with left carotid terminus visualized.  Left carotid terminus appears irregular and slightly narrowed.  Decreased number of visualized left middle cerebral artery branches consistent with the patient's remote infarct.  Moderate to marked focal narrowing A1-A2 junction of the left anterior cerebral artery.  Moderate narrowing supraclinoid aspect of the right internal carotid artery.  Left vertebral artery is  dominant.  Right vertebral artery is of small caliber and irregular after the takeoff of the right PICA.  Mild narrowing portions of the right PICA.  Mild to moderate narrowing AICA greater on the right  Fetal type origin of the left posterior cerebral artery which is of decreased caliber throughout majority of its course.  Bulge at the right posterior communicating artery origin.  This may represent an infundibulum although a tiny aneurysm is not excluded. Stability can be confirmed on follow-up.  This has been made a PRA call report utilizing dashboard call feature.   Original Report Authenticated By: Lacy Duverney, M.D.     Date: 02/29/2012  Rate: 86  Rhythm: normal sinus rhythm  QRS Axis: normal  Intervals: normal  ST/T Wave abnormalities: nonspecific ST/T changes  Conduction Disutrbances:none  Narrative Interpretation:   Old EKG Reviewed: unchanged    1. Hypertension   2. CHF (congestive heart failure)   3. Hypoxia       MDM  Pt with fatigue, SOB, sats 90-91 on RA, pt is not on home oxygen.  Mild CHF changes. No symptoms suggestive of PE.   Family wants pt admitted.  Will consult hospitalist.        Rolan Bucco, MD 02/29/12 [phone]

## 2012-02-29 NOTE — ED Notes (Signed)
Lori Fox, [place], [phone]

## 2012-02-29 NOTE — Consult Note (Signed)
Reason for Consult: capacity Referring Physician: unknown  Lori Fox is an 72 y.o. female.  HPI: Lori Fox is a 72 y.o. 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 Patient did recover well and was sent home. She then underwent a TEE on 02/18/12 which was negative for cardiac thrombi. She then developed dyspnea and had an admission at [facility] where she was treated and released, according to family in the same shape she was before going in. The patient is brought to the ED by the family she is living with (friends/care givers) - because she has dyspnea, weakness, vomiting and diarrhea. In the ED she was found to be hypoxic at 85%.   Seen today. appeared to be confused at times. She knows she needs medical help but not sure about her all medical issues.. Admits she need help with her ADLs at home. She gave the permission to talk with her care givers Steward Drone) today. Pt is willing to go SNF/assisted living now and her care giver also wants the same for her so she can recover well. Per care giver she cant take care her at this time as she is not well medically and she needs a lot of intensive help at home. She is living with her care giver for the last few years now and she likes it there.   Past Medical History  Diagnosis Date  . Hyperlipidemia   . Stroke   . Hypertension   . Arthritis   . PVD (peripheral vascular disease)   . Blindness of left eye   . Allergy   . GERD (gastroesophageal reflux disease)     Past Surgical History  Procedure Date  . Carotid endarterectomy   . Foot surgery rt metatarsal   . Rt knee arthroscopic   . Tee without cardioversion 02/18/2012    Procedure: TRANSESOPHAGEAL ECHOCARDIOGRAM (TEE);  Surgeon: Lewayne Bunting, MD;  Location: [facility] ENDOSCOPY;  Service: Cardiovascular;  Laterality: N/A;    Family History  Problem Relation  Age of Onset  . Cancer Father     head and neck  . Alzheimer's disease Mother     Social History:  reports that she has quit smoking. She does not have any smokeless tobacco history on file. She reports that she does not drink alcohol or use illicit drugs. husband died few years ago.  Allergies:  Allergies  Allergen Reactions  . Codeine Sulfate     REACTION: welts, hives    Medications: I have reviewed the patient's current medications.  Results for orders placed during the hospital encounter of 02/29/12 (from the past 48 hour(s))  CBC WITH DIFFERENTIAL     Status: Abnormal   Collection Time   02/29/12  1:37 PM      Component Value Range Comment   WBC 10.0  4.0 - 10.5 K/uL    RBC 3.31 (*) 3.87 - 5.11 MIL/uL    Hemoglobin 10.6 (*) 12.0 - 15.0 g/dL    HCT 16.1 (*) 09.6 - 46.0 %    MCV 92.4  78.0 - 100.0 fL    MCH 32.0  26.0 - 34.0 pg    MCHC 34.6  30.0 - 36.0 g/dL    RDW 04.5  40.9 - 81.1 %    Platelets 269  150 - 400 K/uL    Neutrophils Relative 93 (*) 43 - 77 %  Neutro Abs 9.2 (*) 1.7 - 7.7 K/uL    Lymphocytes Relative 5 (*) 12 - 46 %    Lymphs Abs 0.5 (*) 0.7 - 4.0 K/uL    Monocytes Relative 3  3 - 12 %    Monocytes Absolute 0.3  0.1 - 1.0 K/uL    Eosinophils Relative 0  0 - 5 %    Eosinophils Absolute 0.0  0.0 - 0.7 K/uL    Basophils Relative 0  0 - 1 %    Basophils Absolute 0.0  0.0 - 0.1 K/uL   COMPREHENSIVE METABOLIC PANEL     Status: Abnormal   Collection Time   02/29/12  1:37 PM      Component Value Range Comment   Sodium 138  135 - 145 mEq/L    Potassium 3.3 (*) 3.5 - 5.1 mEq/L    Chloride 100  96 - 112 mEq/L    CO2 23  19 - 32 mEq/L    Glucose, Bld 164 (*) 70 - 99 mg/dL    BUN 19  6 - 23 mg/dL    Creatinine, Ser 1.61 (*) 0.50 - 1.10 mg/dL    Calcium 09.6 (*) 8.4 - 10.5 mg/dL    Total Protein 7.6  6.0 - 8.3 g/dL    Albumin 3.7  3.5 - 5.2 g/dL    AST 14  0 - 37 U/L    ALT 12  0 - 35 U/L    Alkaline Phosphatase 138 (*) 39 - 117 U/L    Total Bilirubin  0.6  0.3 - 1.2 mg/dL    GFR calc non Af Amer 33 (*) >90 mL/min    GFR calc Af Amer 38 (*) >90 mL/min   URINALYSIS, ROUTINE W REFLEX MICROSCOPIC     Status: Normal   Collection Time   02/29/12  2:17 PM      Component Value Range Comment   Color, Urine YELLOW  YELLOW    APPearance CLEAR  CLEAR    Specific Gravity, Urine 1.011  1.005 - 1.030    pH 5.0  5.0 - 8.0    Glucose, UA NEGATIVE  NEGATIVE mg/dL    Hgb urine dipstick NEGATIVE  NEGATIVE    Bilirubin Urine NEGATIVE  NEGATIVE    Ketones, ur NEGATIVE  NEGATIVE mg/dL    Protein, ur NEGATIVE  NEGATIVE mg/dL    Urobilinogen, UA 0.2  0.0 - 1.0 mg/dL    Nitrite NEGATIVE  NEGATIVE    Leukocytes, UA NEGATIVE  NEGATIVE MICROSCOPIC NOT DONE ON URINES WITH NEGATIVE PROTEIN, BLOOD, LEUKOCYTES, NITRITE, OR GLUCOSE <1000 mg/dL.  POCT I-STAT TROPONIN I     Status: Normal   Collection Time   02/29/12  3:09 PM      Component Value Range Comment   Troponin i, poc 0.02  0.00 - 0.08 ng/mL    Comment 3            D-DIMER, QUANTITATIVE     Status: Abnormal   Collection Time   02/29/12  4:55 PM      Component Value Range Comment   D-Dimer, Quant 2.89 (*) 0.00 - 0.48 ug/mL-FEU   PRO B NATRIURETIC PEPTIDE     Status: Abnormal   Collection Time   02/29/12  4:55 PM      Component Value Range Comment   Pro B Natriuretic peptide (BNP) 7880.0 (*) 0 - 125 pg/mL   PROTIME-INR     Status: Normal   Collection Time   02/29/12  4:55 PM      Component Value Range Comment   Prothrombin Time 15.0  11.6 - 15.2 seconds    INR 1.20  0.00 - 1.49     Dg Chest 2 View  02/29/2012  *RADIOLOGY REPORT*  Clinical Data: Shortness of breath.  CHEST - 2 VIEW  Comparison: 02/15/2012.  Findings: Trachea is midline.  Heart size normal.  Interval improvement in diffuse mixed interstitial and airspace disease, without complete resolution.  Tiny bilateral pleural effusions. Biapical pleural parenchymal scarring.  IMPRESSION: Mild congestive heart failure, improved from 02/15/2012.    Original Report Authenticated By: Leanna Battles, M.D.     ROS Blood pressure 204/47, pulse 83, temperature 98.1 F (36.7 C), temperature source Oral, resp. rate 19, weight 69.5 kg (153 lb 3.5 oz), SpO2 96.00%. Physical Exam      Alert on bed   Mental Status Examination/Evaluation:   Appearance: on bed confused   Eye Contact::  fair   Speech: limited   Volume: low   Mood: ok   Affect: ristricted   Thought Process: organized most of the time   Orientation: 4/4   Thought Content: denies AVH   Suicidal Thoughts: No   Homicidal Thoughts: no   Memory: Recent; Poor   Judgement: Impaired   Insight: Lacking   Psychomotor Activity: slow   Concentration: poor   Recall: poor   Akathisia: No   Assessment:   AXIS I: cognitive d/o nos, r/o dementia AXIS II: Deferred   AXIS III: see emdical hx ?     AXIS IV: problems related to social environment, problems with access to health care services and problems with primary support group   AXIS V: 15   ?   Treatment Plan/Recommendations:   - pt is willing to go for SNF/assisted living now. Her care giver wants the same. Please call us again if she refuse it again and we can evaluate her for capacity   - will recommend out pt psy f/u after discharge including dementia work up as out pt   - will sign off at this time . Thanks for consult. Psy SW can help with placement if needed.  Wonda Cerise 02/29/2012, 9:25 PM

## 2012-02-29 NOTE — ED Notes (Signed)
Dr. Belfi at the bedside.  

## 2012-02-29 NOTE — ED Notes (Signed)
Pt returned from xray

## 2012-02-29 NOTE — ED Notes (Signed)
Pt reports diarrhea x 1 week.  Reports weakness and lightheadness since being discharged from [facility] 2 days ago.  Pt pale, weak.

## 2012-02-29 NOTE — H&P (Signed)
[facility] History and Physical  Lori Fox AVW:[id number] DOB: 1940-06-13 DOA: 02/29/2012  Referring physician: ED PCP: Carrie Mew, MD  Specialists: none  Chief Complaint:  Chief Complaint  Patient presents with  . Fatigue     HPI: Lori Fox is a 72 y.o. 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 Patient did recover well and was sent home. She then underwent a TEE on 02/18/12 which was negative for cardiac thrombi. She then developed dyspnea and had an admission at [facility] where she was treated and released, according to family in the same shape she was before going in. (records have been requested and are getting faxed to [place] now). The patient is brought to the ED by the family she is living with (friends) - because she has dyspnea, weakness, vomiting and diarrhea. In the ED she was found to be hypoxic at 85% RA, tachypnea, afebrile and with extreme elevation in BP readings. (206/105). She is referred for admission.   Review of Systems:  The patient has balance deficits and edema,, chronic right side weakness and dysarthria,  The patient denies anorexia, fever, weight loss,, vision loss, decreased hearing, hoarseness, chest pain, syncope,  hemoptysis, abdominal pain, melena, hematochezia, severe indigestion/heartburn, hematuria, incontinence, genital sores, muscle weakness, suspicious skin lesions, transient blindness, depression, unusual weight change, abnormal bleeding, enlarged lymph nodes, angioedema, and breast masses.   Past Medical History  Diagnosis Date  . Hyperlipidemia   . Stroke   . Hypertension   . Arthritis   . PVD (peripheral vascular disease)   . Blindness of left eye   . Allergy   . GERD (gastroesophageal reflux disease)    Past Surgical History  Procedure Date  . Carotid endarterectomy   . Foot surgery rt  metatarsal   . Rt knee arthroscopic   . Tee without cardioversion 02/18/2012    Procedure: TRANSESOPHAGEAL ECHOCARDIOGRAM (TEE);  Surgeon: Lewayne Bunting, MD;  Location: [facility] ENDOSCOPY;  Service: Cardiovascular;  Laterality: N/A;   Social History:  reports that she has quit smoking. She does not have any smokeless tobacco history on file. She reports that she does not drink alcohol or use illicit drugs. Patient lives with a friend and her family for the last 10 years after her husband died.  Wrenn,Brenda [place] [phone]   Allergies  Allergen Reactions  . Codeine Sulfate     REACTION: welts, hives    Family History  Problem Relation Age of Onset  . Cancer Father     head and neck  . Alzheimer's disease Mother     Prior to Admission medications   Medication Sig Start Date End Date Taking? Authorizing Provider  ALPRAZolam Prudy Feeler) 0.5 MG tablet Take 0.5 mg by mouth 2 (two) times daily.   Yes Historical Provider, MD  amLODipine (NORVASC) 10 MG tablet Take 1 tablet (10 mg total) by mouth daily. 02/03/12  Yes Ripudeep Jenna Luo, MD  aspirin 81 MG chewable tablet Chew 362 mg by mouth once.   Yes Historical Provider, MD  carvedilol (COREG) 25 MG tablet TAKE 1 TABLET TWICE DAILY 12/03/11  Yes Stacie Glaze, MD  cloNIDine (CATAPRES) 0.2 MG tablet  06/27/11  Yes Stacie Glaze, MD  clopidogrel (PLAVIX) 75 MG tablet Take 75 mg by mouth daily.   Yes Historical Provider, MD  cyclobenzaprine (FLEXERIL) 10 MG tablet Take 10 mg  by mouth at bedtime as needed. Muscle spasms   Yes Historical Provider, MD  escitalopram (LEXAPRO) 10 MG tablet TAKE 1 TABLET BY MOUTH ONCE DAILY 08/28/11  Yes Stacie Glaze, MD  esomeprazole (NEXIUM) 40 MG capsule Take 40 mg by mouth daily as needed. Acid reflux   Yes Historical Provider, MD  hydrALAZINE (APRESOLINE) 50 MG tablet Take 1 tablet (50 mg total) by mouth 2 (two) times daily. 02/12/12  Yes Baker Pierini, FNP  rosuvastatin (CRESTOR) 20 MG tablet Take 20  mg by mouth once a week. At night on fridays 05/22/11  Yes Stacie Glaze, MD  solifenacin (VESICARE) 5 MG tablet Take 2 tablets (10 mg total) by mouth daily. 02/12/12  Yes Baker Pierini, FNP  zolpidem (AMBIEN) 10 MG tablet take 1 tablet by mouth at bedtime if needed for sleep 02/16/12  Yes Stacie Glaze, MD   Physical Exam: Filed Vitals:   02/29/12 1545 02/29/12 1606 02/29/12 1630 02/29/12 1700  BP: 197/43 191/138 198/64 179/44  Pulse: 83 85 84 76  Temp:      TempSrc:      Resp: 22  26 20   SpO2: 96% 95% 96% 95%     General:  Alert and oriented, has difficulty remembering things, unable to recall procedures treatments done at [facility] this month  Eyes: perrla, eomi   ENT: clear pharynx  Neck: positive JVD  Cardiovascular: rrr, no m, r,g , +3 bilat LE edema   Respiratory: ctab , no w,r,c   Abdomen: soft, nt, bs present   Skin: pale, dry, multiple ecchymoses scattered around the body,     Musculoskeletal: decrease muscle bulk and tone   Psychiatric: depressed, labile   Neurologic: cn 2-12 intact, strength 5/5 LUE, 2/5 RUE with contractures in the right hand, strength 5/5 bilat LE   Labs on Admission:  Basic Metabolic Panel:  Lab 02/29/12 4098  NA 138  K 3.3*  CL 100  CO2 23  GLUCOSE 164*  BUN 19  CREATININE 1.55*  CALCIUM 10.7*  MG --  PHOS --   Liver Function Tests:  Lab 02/29/12 1337  AST 14  ALT 12  ALKPHOS 138*  BILITOT 0.6  PROT 7.6  ALBUMIN 3.7   No results found for this basename: LIPASE:5,AMYLASE:5 in the last 168 hours No results found for this basename: AMMONIA:5 in the last 168 hours CBC:  Lab 02/29/12 1337  WBC 10.0  NEUTROABS 9.2*  HGB 10.6*  HCT 30.6*  MCV 92.4  PLT 269  Results for KRISANDRA, BUENO (MRN [id number]) as of 02/29/2012 17:41  Ref. Range 02/29/2012 15:09 02/29/2012 16:55  Troponin i, poc Latest Range: 0.00-0.08 ng/mL 0.02   Pro B Natriuretic peptide (BNP) Latest Range: 0-125 pg/mL  7880.0 (H)  D-Dimer, Quant Latest Range:  0.00-0.48 ug/mL-FEU  2.89 (H)  Prothrombin Time Latest Range: 11.6-15.2 seconds  15.0  INR Latest Range: 0.00-1.49   1.20   Cardiac Enzymes: No results found for this basename: CKTOTAL:5,CKMB:5,CKMBINDEX:5,TROPONINI:5 in the last 168 hours  BNP (last 3 results)  Basename 02/29/12 1655  PROBNP 7880.0*   CBG: No results found for this basename: GLUCAP:5 in the last 168 hours  Radiological Exams on Admission: Dg Chest 2 View  02/29/2012  *RADIOLOGY REPORT*  Clinical Data: Shortness of breath.  CHEST - 2 VIEW  Comparison: 02/15/2012.  Findings: Trachea is midline.  Heart size normal.  Interval improvement in diffuse mixed interstitial and airspace disease, without complete resolution.  Tiny bilateral pleural effusions.  Biapical pleural parenchymal scarring.  IMPRESSION: Mild congestive heart failure, improved from 02/15/2012.   Original Report Authenticated By: Leanna Battles, M.D.     EKG: Independently reviewed. NSR, ST depression of less than 1 mm in V5,V6  Assessment/Plan Principal Problem:  *Acute respiratory failure Active Problems:  ANXIETY DISORDER, ACUTE  HYPERTENSION, MALIGNANT ESSENTIAL  CEREBROVASCULAR DISEASE LATE EFFECTS, NOS  PERIPHERAL VASCULAR DISEASE  CVA (cerebral infarction)  Hypokalemia  Anemia  Acute on chronic diastolic CHF (congestive heart failure), NYHA class 4  CKD (chronic kidney disease) stage 3, GFR 30-59 ml/min   1. Hypoxic resp failure - most likely from CHF exacerbation . Will treat with iv lasix and try to titrate oxygen off. Since the patient has poor mobility - and an elevated D dimer, tachypnea,  with a clear CXR I think we ought to investigate for possible PE. She does have CKD, and I think it would be prudent to get a VQ scan. Will follow results.  2. Hx of CVA - deficits seem to be at baseline - continue aspirin and plavix 3. Malignant HTN - uncontrolled - will place patient on coreg BID, Hydralazine TID, Norvasc and iv lasix. Try to avoid  clonidine. 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 Hypokalemia - replete   5. PVD s/p remote Right ICA  6. Chronic anxiety - continue benzos 7. Capacity - family friend was wondering if patient has capacity to decline snf - will ask psych to see.    Code Status: full  Family Communication: POA ' s husband  Disposition Plan: snf   Time spent: 2 hours   Knoah Nedeau [facility] Pager [phone]  If 7PM-7AM, please contact night-coverage [email or web address] Password [facility] 02/29/2012, 5:48 PM

## 2012-03-01 ENCOUNTER — Encounter (HOSPITAL_COMMUNITY): Payer: Self-pay | Admitting: Urology

## 2012-03-01 ENCOUNTER — Inpatient Hospital Stay (HOSPITAL_COMMUNITY): Payer: Medicare Other

## 2012-03-01 DIAGNOSIS — I10 Essential (primary) hypertension: Secondary | ICD-10-CM

## 2012-03-01 DIAGNOSIS — D649 Anemia, unspecified: Secondary | ICD-10-CM

## 2012-03-01 DIAGNOSIS — R0602 Shortness of breath: Secondary | ICD-10-CM

## 2012-03-01 LAB — BASIC METABOLIC PANEL
BUN: 17 mg/dL (ref 6–23)
Chloride: 95 mEq/L — ABNORMAL LOW (ref 96–112)
GFR calc Af Amer: 37 mL/min — ABNORMAL LOW (ref >90)
GFR calc non Af Amer: 32 mL/min — ABNORMAL LOW (ref >90)
Glucose, Bld: 140 mg/dL — ABNORMAL HIGH (ref 70–99)
Potassium: 3.4 mEq/L — ABNORMAL LOW (ref 3.5–5.1)
Sodium: 137 mEq/L (ref 135–145)

## 2012-03-01 LAB — CBC
HCT: 33.3 % — ABNORMAL LOW (ref 36.0–46.0)
Hemoglobin: 11.4 g/dL — ABNORMAL LOW (ref 12.0–15.0)
MCH: 31.8 pg (ref 26.0–34.0)
MCHC: 34.2 g/dL (ref 30.0–36.0)
RBC: 3.58 MIL/uL — ABNORMAL LOW (ref 3.87–5.11)

## 2012-03-01 MED ORDER — POTASSIUM CHLORIDE CRYS ER 20 MEQ PO TBCR
40.0000 meq | EXTENDED_RELEASE_TABLET | Freq: Two times a day (BID) | ORAL | Status: AC
  Administered 2012-03-01 – 2012-03-02 (×4): 40 meq via ORAL
  Filled 2012-03-01 (×4): qty 2

## 2012-03-01 MED ORDER — TECHNETIUM TC 99M DIETHYLENETRIAME-PENTAACETIC ACID: 40.0000 | Freq: Once | INTRAVENOUS | Status: AC | PRN

## 2012-03-01 MED ORDER — TECHNETIUM TO 99M ALBUMIN AGGREGATED
3.0000 | Freq: Once | INTRAVENOUS | Status: AC | PRN
  Administered 2012-03-01: 3 via INTRAVENOUS

## 2012-03-01 MED ORDER — FUROSEMIDE 10 MG/ML IJ SOLN
40.0000 mg | Freq: Two times a day (BID) | INTRAMUSCULAR | Status: DC
  Administered 2012-03-01 – 2012-03-02 (×2): 40 mg via INTRAVENOUS
  Filled 2012-03-01 (×2): qty 4

## 2012-03-01 MED ORDER — CLONIDINE HCL 0.1 MG PO TABS
0.1000 mg | ORAL_TABLET | Freq: Three times a day (TID) | ORAL | Status: DC
  Administered 2012-03-01 – 2012-03-04 (×9): 0.1 mg via ORAL
  Filled 2012-03-01 (×12): qty 1

## 2012-03-01 NOTE — Progress Notes (Signed)
Notified Gill in [place] that pt had few bites of her jello for breakfast this am and she instructed that was all right.  Stated to keep her NPO until 1330 pm today and ok to have her meds with sips of H20.  Amanda Pea, Charity fundraiser.

## 2012-03-01 NOTE — Progress Notes (Signed)
[facility]                                                                                Patient Demographics  Lori Fox, is a 72 y.o. female  ZOX:[id number]  [id number]  DOB - [birth year]  Admit date - 02/29/2012  Admitting Physician Sorin Luanne Bras, MD  Outpatient Primary MD for the patient is Carrie Mew, MD  LOS - 1   Chief Complaint  Patient presents with  . Fatigue        Assessment & Plan    Principal Problem:  *Acute respiratory failure Active Problems:  ANXIETY DISORDER, ACUTE  HYPERTENSION, MALIGNANT ESSENTIAL  CEREBROVASCULAR DISEASE LATE EFFECTS, NOS  PERIPHERAL VASCULAR DISEASE  CVA (cerebral infarction)  Hypokalemia  Anemia  Acute on chronic diastolic CHF (congestive heart failure), NYHA class 4  CKD (chronic kidney disease) stage 3, GFR 30-59 ml/min   Patient with recent history of CVA causing right upper extremity weakness, who lives with a friend at home who is also her power of attorney, was also recently hospitalized to hypertension hospital for shortness of breath due to committed for pneumonia and diastolic CHF, she was discharged on the 11th of this month, however she presented again to our hospital for shortness of breath likely due to another episode of acute on chronic diastolic CHF and elevated blood pressures. I have reviewed her chart from [facility] patient had a negative VQ scan, echo showing an EF of 60% with diastolic CHF. Upon admission there was question about placement in her capacity evaluation, psych saw the patient and have decided that patient is able to make her decision and at this time she is agreeable to going to a rehabilitation if need be.   1. Acute hypoxic respiratory failure - due to acute on chronic diastolic CHF along with hypertensive urgency , her recent echo shows EF 60% and diastolic dysfunction done in January 2014 echo done at [facility] - patient is already shown  remarkable improvement with diuresis we'll taper Lasix to twice a day, continue beta blocker, along with hydralazine, Catapres for her elevated blood pressures. Clinically much improved, titrate off oxygen as tolerated, when necessary nebulizer treatments. Upon admission d-dimer was elevated V/Q scan and lower with the ultrasound have been ordered which are pending we will follow the results, clinically I doubt she has a PE especially with recent negative VQ scan few weeks ago and clinical response to above treatment.   2. Recent CVA with right upper external the weakness, PT evaluation, continue Plavix and statin. No acute issues of note patient also has partial foot of dictation and wears a brace in that leg from before.    3. Hypertension poorly controlled, medications adjusted currently on Coreg, hydralazine, Lasix, Catapres monitor trend.    4. Dyslipidemia on statin continue.    5. Questionable dementia, capacity to take decisions. She has a by mouth 8, for now she is agreeable to placement, appreciate psych help, outpatient evaluation for dementia post discharge.    6. Hypokalemia replace potassium, repeat BMP and magnesium in the morning.    7. Chronic kidney disease stage III baseline creatinine 1.3.. Creatinine  was 2.13 on 02/20/2012 at [facility], we will monitor the BMP trend. Lasix will be tapered to twice a day dose as she is improving from shortness of breath standpoint.    Code Status: full  Family Communication: patient  Disposition Plan: TBD   Procedures V/Q - Leg [place]   Consults  Psych   DVT Prophylaxis  Lovenox    Lab Results  Component Value Date   PLT 292 03/01/2012    Medications  Scheduled Meds:    . ALPRAZolam  0.5 mg Oral BID  . amLODipine  10 mg Oral Daily  . aspirin EC  81 mg Oral Daily  . atorvastatin  10 mg Oral q1800  . carvedilol  25 mg Oral BID WC  . cloNIDine  0.1 mg Oral TID  . clopidogrel  75 mg Oral Daily  .  enoxaparin (LOVENOX) injection  40 mg Subcutaneous Q24H  . escitalopram  10 mg Oral Daily  . furosemide  40 mg Intravenous Q12H  . hydrALAZINE  50 mg Oral Q8H  . pantoprazole  80 mg Oral Q1200  . potassium chloride  40 mEq Oral BID  . sodium chloride  3 mL Intravenous Q12H   Continuous Infusions:  PRN Meds:.acetaminophen, albuterol, alum & mag hydroxide-simeth, ondansetron (ZOFRAN) IV, zolpidem  Antibiotics    Anti-infectives    None       Time Spent in minutes   55   SINGH,PRASHANT K M.D on 03/01/2012 at 10:41 AM  Between 7am to 7pm - Pager - [phone]  After 7pm go to [email or web address] - password [email or web address]  And look for the night coverage person covering for me after hours  [facility] Office  [phone]    Subjective:   Lori Fox today has, No headache, No chest pain, No abdominal pain - No Nausea, No new weakness tingling or numbness, No Cough - SOB.    Objective:   Filed Vitals:   02/29/12 2203 03/01/12 0444 03/01/12 0603 03/01/12 1024  BP: 177/41 197/63 183/61 154/59  Pulse:  79    Temp:  97.7 F (36.5 C)    TempSrc:  Oral    Resp:  19    Height:  5\' 9"  (1.753 m)    Weight:  66.4 kg (146 lb 6.2 oz)    SpO2:  95%      Wt Readings from Last 3 Encounters:  03/01/12 66.4 kg (146 lb 6.2 oz)  02/12/12 74.844 kg (165 lb)  02/02/12 74.9 kg (165 lb 2 oz)     Intake/Output Summary (Last 24 hours) at 03/01/12 1041 Last data filed at 03/01/12 0944  Gross per 24 hour  Intake    188 ml  Output   1475 ml  Net  -1287 ml    Exam Awake Alert,   No new F.N deficits, RUE weak,  Normal affect [place].AT,PERRAL Supple Neck,No JVD, No cervical lymphadenopathy appriciated.  Symmetrical Chest wall movement, Good air movement bilaterally, CTAB RRR,No Gallops,Rubs or new Murmurs, No Parasternal Heave +ve B.Sounds, Abd Soft, Non tender, No organomegaly appriciated, No rebound - guarding or rigidity. No Cyanosis, Clubbing or edema, No new Rash or bruise ,  R  foot partial amputation    Data Review   Micro Results No results found for this or any previous visit (from the past 240 hour(s)).  Radiology Reports Dg Chest 2 View  02/29/2012  *RADIOLOGY REPORT*  Clinical Data: Shortness of breath.  CHEST - 2 VIEW  Comparison: 02/15/2012.  Findings: Trachea  is midline.  Heart size normal.  Interval improvement in diffuse mixed interstitial and airspace disease, without complete resolution.  Tiny bilateral pleural effusions. Biapical pleural parenchymal scarring.  IMPRESSION: Mild congestive heart failure, improved from 02/15/2012.   Original Report Authenticated By: Leanna Battles, M.D.    Dg Chest 2 View  02/15/2012  *RADIOLOGY REPORT*  Clinical Data: Hypertension. Shortness of breath.  CHEST - 2 VIEW  Comparison: PA and lateral chest 03/27/2003.  Findings: The patient has small bilateral pleural effusions, larger on the left.  There is patchy bilateral airspace disease appearing worst in the bases and greater on the left.  Heart size is upper normal.  IMPRESSION: Small bilateral pleural effusions and patchy airspace disease could be due to asymmetric edema or pneumonia.   Original Report Authenticated By: Holley Dexter, M.D.    Ct Head Wo Contrast  02/01/2012  *RADIOLOGY REPORT*  Clinical Data: Spasms  CT HEAD WITHOUT CONTRAST  Technique:  Contiguous axial images were obtained from the base of the skull through the vertex without contrast.  Comparison: CT 09/22/2008  Findings: Chronic left MCA infarct is unchanged.  There is volume loss in the left with dilatation of the left lateral ventricle.  No acute infarct.  Negative for hemorrhage or mass lesion.  Mild chronic microvascular ischemia in the cerebral white matter on the right.  Negative for hemorrhage or mass.  Calvarium is intact.  IMPRESSION: Chronic left MCA infarct.  No acute abnormality.   Original Report Authenticated By: Janeece Riggers, M.D.       CBC  Lab 03/01/12 0640 02/29/12 1337  WBC 8.6  10.0  HGB 11.4* 10.6*  HCT 33.3* 30.6*  PLT 292 269  MCV 93.0 92.4  MCH 31.8 32.0  MCHC 34.2 34.6  RDW 13.0 12.8  LYMPHSABS -- 0.5*  MONOABS -- 0.3  EOSABS -- 0.0  BASOSABS -- 0.0  BANDABS -- --    Chemistries   Lab 03/01/12 0640 02/29/12 1337  NA 137 138  K 3.4* 3.3*  CL 95* 100  CO2 24 23  GLUCOSE 140* 164*  BUN 17 19  CREATININE 1.59* 1.55*  CALCIUM 10.5 10.7*  MG -- --  AST -- 14  ALT -- 12  ALKPHOS -- 138*  BILITOT -- 0.6   ------------------------------------------------------------------------------------------------------------------ estimated creatinine clearance is 34.4 ml/min (by C-G formula based on Cr of 1.59). ------------------------------------------------------------------------------------------------------------------ No results found for this basename: HGBA1C:2 in the last 72 hours ------------------------------------------------------------------------------------------------------------------ No results found for this basename: CHOL:2,HDL:2,LDLCALC:2,TRIG:2,CHOLHDL:2,LDLDIRECT:2 in the last 72 hours ------------------------------------------------------------------------------------------------------------------ No results found for this basename: TSH,T4TOTAL,FREET3,T3FREE,THYROIDAB in the last 72 hours ------------------------------------------------------------------------------------------------------------------ No results found for this basename: VITAMINB12:2,FOLATE:2,FERRITIN:2,TIBC:2,IRON:2,RETICCTPCT:2 in the last 72 hours  Coagulation profile  Lab 02/29/12 1655  INR 1.20  PROTIME --     Basename 02/29/12 1655  DDIMER 2.89*    Cardiac Enzymes No results found for this basename: CK:3,CKMB:3,TROPONINI:3,MYOGLOBIN:3 in the last 168 hours ------------------------------------------------------------------------------------------------------------------ No components found with this basename: POCBNP:3

## 2012-03-01 NOTE — Evaluation (Addendum)
Physical Therapy Evaluation Patient Details Name: Lori Fox MRN: [id number] DOB: 1940-09-29 Today's Date: 03/01/2012 Time:  -     PT Assessment / Plan / Recommendation Clinical Impression  Pt admitted with hypoxia and weakness. Pt currently presenting near baseline function on 2L O2 throughout activity without dyspnea. Pt states she has lived with caregiver for 53yrs and that she has 24hr assist and doesn't feel she needs any ST-SNF. As long as caregiver able to continue to provide supervision and assist for pt and she can meet goals prior to discharge return home is a reasonable plan. Will follow acutely to maximize mobility and gait. Pt incontinent of bowel and bladder on arrival and assisted with pericare with pt stating she wears depends at home due to incontinence. Pt able to don AFO and left shoe sitting EOB without assist.    PT Assessment  Patient needs continued PT services    Follow Up Recommendations  Home health PT;Supervision for mobility/OOB    Does the patient have the potential to tolerate intense rehabilitation      Barriers to Discharge None      Equipment Recommendations  None recommended by PT    Recommendations for Other Services     Frequency Min 3X/week    Precautions / Restrictions Precautions Precautions: Fall Precaution Comments: right hemiparesis Required Braces or Orthoses: Other Brace/Splint Other Brace/Splint: R AFO   Pertinent Vitals/Pain No pain      Mobility  Bed Mobility Bed Mobility: Supine to Sit Supine to Sit: 4: Min assist;HOB elevated;With rails (HOb 30 degrees) Details for Bed Mobility Assistance: with cueing and assist to pivot to EOB and to fully elevate trunk Transfers Transfers: Sit to Stand;Stand to Sit Sit to Stand: 4: Min guard;From bed Stand to Sit: 4: Min guard;To chair/3-in-1 Details for Transfer Assistance: guarding for safety without cueing for hand placement Ambulation/Gait Ambulation/Gait Assistance: 4: Min  assist (assist for HHA since cane not present) Ambulation Distance (Feet): 100 Feet Assistive device: 1 person hand held assist Gait Pattern: Step-to pattern;Decreased dorsiflexion - right Gait velocity: decreased Stairs: No    Shoulder Instructions     Exercises     PT Diagnosis: Abnormality of gait  PT Problem List: Decreased activity tolerance;Decreased mobility PT Treatment Interventions: Gait training;Stair training;Functional mobility training;Therapeutic activities;Patient/family education   PT Goals Acute Rehab PT Goals PT Goal Formulation: With patient Time For Goal Achievement: 03/08/12 Potential to Achieve Goals: Good Pt will go Supine/Side to Sit: with modified independence;with HOB 0 degrees PT Goal: Supine/Side to Sit - Progress: Goal set today Pt will go Sit to Supine/Side: with modified independence;with HOB 0 degrees PT Goal: Sit to Supine/Side - Progress: Goal set today Pt will go Sit to Stand: with supervision PT Goal: Sit to Stand - Progress: Goal set today Pt will go Stand to Sit: with supervision PT Goal: Stand to Sit - Progress: Goal set today Pt will Ambulate: >150 feet;with supervision;with cane PT Goal: Ambulate - Progress: Goal set today Pt will Go Up / Down Stairs: 1-2 stairs;with rail(s);with supervision PT Goal: Up/Down Stairs - Progress: Goal set today  Visit Information       Subjective Data  Subjective: She is around all the time but wants me to go to rehab for a little bit Patient Stated Goal: return home   Prior Functioning  Home Living Lives With: Other (Comment) Available Help at Discharge: Friend(s);Available 24 hours/day Type of Home: House Home Access: Stairs to enter [employer] of Steps: 2  Entrance Stairs-Rails: Right Home Layout: One level Bathroom Shower/Tub: Health visitor: Standard Home Adaptive Equipment: Quad cane;Other (comment) (right AFO) Prior Function Level of Independence: Needs  assistance Needs Assistance: Light Housekeeping;Meal Prep Meal Prep: Total Light Housekeeping: Total Able to Take Stairs?: Yes (with rail ) Driving: No Vocation: Retired Comments: pt blind in left eye. Pt states she can perform bathing and dressing on her own and as long as she has AFO and cane can ambulate without assist with report of no falls Communication Communication: No difficulties    Cognition  Overall Cognitive Status: Impaired Arousal/Alertness: Awake/alert Orientation Level: Disoriented to;Time;Place Behavior During Session: [facility] for tasks performed    Extremity/Trunk Assessment Right Upper Extremity Assessment RUE ROM/Strength/Tone: Deficits RUE ROM/Strength/Tone Deficits: long standing hemiparesis from CVA 48yrs ago Left Upper Extremity Assessment LUE ROM/Strength/Tone: WFL for tasks assessed Right Lower Extremity Assessment RLE ROM/Strength/Tone: Deficits RLE ROM/Strength/Tone Deficits: dorsiflexion not tested due to chronic AFO use, hip flexion 3/5, knee extension 3+/5, knee flexion 3/5 Left Lower Extremity Assessment LLE ROM/Strength/Tone: Within functional levels Trunk Assessment Trunk Assessment: Normal   Balance    End of Session PT - End of Session Equipment Utilized During Treatment: Gait belt;Right ankle foot orthosis Activity Tolerance: Patient tolerated treatment well Patient left: in chair;with family/visitor present Nurse Communication: Mobility status  GP     Delorse Lek 03/01/2012, 4:38 PM  Delaney Meigs, PT [phone]

## 2012-03-01 NOTE — Progress Notes (Addendum)
*  PRELIMINARY RESULTS* Vascular Ultrasound Renal Artery Duplex has been completed.   There is no obvious evidence of hemodynamically significant renal artery stenosis bilaterally. Unable to visualize left superior and mid arcuate artery segments, as well as proximal left renal artery due to excessive bowel gas. There is evidence of elevated celiac trunk and superior mesenteric artery velocities, suggestive of stenosis.   *Preliminary Results* Bilateral lower extremity venous duplex completed. Bilateral lower extremities are negative for deep vein thrombosis. No evidence of Baker's cyst bilaterally.  Preliminary results discussed with Nellie, RN.  03/01/2012 1:48 PM Gertie Fey, RDMS, RDCS

## 2012-03-01 NOTE — Progress Notes (Signed)
PT/OT Cancellation Note  Patient Details Name: CASIMIRA SUTPHIN MRN: [id number] DOB: August 07, 1940   Cancelled Treatment:    Reason Eval/Treat Not Completed: Patient at procedure or test/unavailable. Pt leaving room for nuclear med will attempt in Pm as time allows. Delaney Meigs, PT [phone]

## 2012-03-01 NOTE — Progress Notes (Signed)
Utilization Review Completed Dondrell Loudermilk J. Maahir Horst, RN, BSN, NCM [phone]  

## 2012-03-01 NOTE — Progress Notes (Signed)
Clinical Social Work  CSW attempted to meet with patient but patient currently having procedure completed in room. CSW will follow up at a later time.  [place], [place] [phone]

## 2012-03-01 NOTE — Progress Notes (Signed)
Pt BP136/46, HR 57. Clonidine 0.1mg  ordered. MD notified and ordered to hold dose of clonidine. Will continue to monitor and assess. Jobe Igo A RN 03/01/2012

## 2012-03-01 NOTE — Progress Notes (Signed)
SLP Cancellation Note  Patient Details Name: COTINA FREEDMAN MRN: [id number] DOB: 10-27-1940   Cancelled treatment:        Attempted to see patient for swallow evaluation.  Patient currently in nuclear med for procedure.  Will return later today.   Maryjo Rochester T 03/01/2012, 12:03 PM

## 2012-03-01 NOTE — Evaluation (Signed)
Clinical/Bedside Swallow Evaluation Patient Details  Name: Lori Fox MRN: [id number] Date of Birth: Nov 17, 1940  Today's Date: 03/01/2012 Time: 1648-     Past Medical History:  Past Medical History  Diagnosis Date  . Hyperlipidemia   . Stroke   . Hypertension   . Arthritis   . PVD (peripheral vascular disease)   . Blindness of left eye   . Allergy   . GERD (gastroesophageal reflux disease)    Past Surgical History:  Past Surgical History  Procedure Date  . Carotid endarterectomy   . Foot surgery rt metatarsal   . Rt knee arthroscopic   . Tee without cardioversion 02/18/2012    Procedure: TRANSESOPHAGEAL ECHOCARDIOGRAM (TEE);  Surgeon: Lewayne Bunting, MD;  Location: [facility] ENDOSCOPY;  Service: Cardiovascular;  Laterality: N/A;   HPI:  Lori Fox is a 72 y.o. 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 Patient did recover well and was sent home. She then underwent a TEE on 02/18/12 which was negative for cardiac thrombi. She then developed dyspnea and had an admission at [facility] where she was treated and released, according to family in the same shape she was before going in. (records have been requested and are getting faxed to [place] now). The patient is brought to the ED by the family she is living with (friends) - because she has dyspnea, weakness, vomiting and diarrhea. In the ED she was found to be hypoxic at 85% RA, tachypnea, afebrile and with extreme elevation in BP readings. (206/105). She is referred for admission.   Assessment / Plan / Recommendation Clinical Impression  Patient does not show any overt s/s of aspiration at bedside, but does have difficulty chewing due to not having full dentition (dentures are at home.  Otherwise, patient appears to be swallowing without difficulty.  CXR reports mild CHF.      Aspiration Risk  Mild      Diet Recommendation Dysphagia 2 (Fine chop);Thin liquid   Liquid Administration via: Straw Medication Administration: Whole meds with liquid Supervision: Patient able to self feed;Intermittent supervision to cue for compensatory strategies Compensations: Slow rate;Small sips/bites Postural Changes and/or Swallow Maneuvers: Seated upright 90 degrees    Other  Recommendations Oral Care Recommendations: Oral care QID Other Recommendations: Clarify dietary restrictions   Follow Up Recommendations  None    Frequency and Duration min 2x/week  1 week   Pertinent Vitals/Pain n/a    SLP Swallow Goals Patient will consume recommended diet without observed clinical signs of aspiration with: Supervision/safety;Set-up   Swallow Study Prior Functional Status  Type of Home: House Lives With: Other (Comment) Available Help at Discharge: Friend(s);Available 24 hours/day Vocation: Retired    Radio producer HPI: Lori Fox is a 72 y.o. 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 Patient did recover well and was sent home. She then underwent a TEE on 02/18/12 which was negative for cardiac thrombi. She then developed dyspnea and had an admission at [facility] where she was treated and released, according to family in the same shape she was before going in. (records have been requested and are getting faxed to [place] now). The patient is brought to the ED by the family she is living with (friends) - because she has dyspnea,  weakness, vomiting and diarrhea. In the ED she was found to be hypoxic at 85% RA, tachypnea, afebrile and with extreme elevation in BP readings. (206/105). She is referred for admission. Type of Study: Bedside swallow evaluation Previous Swallow Assessment: n/a Diet Prior to this Study: Thin liquids;Other (Comment) (Clears) Temperature Spikes Noted:  No Respiratory Status: Supplemental O2 delivered via (comment) History of Recent Intubation: No Behavior/Cognition: Alert;Cooperative Oral Cavity - Dentition: Dentures, top;Dentures, not available (Lower dentures at home.) Self-Feeding Abilities: Able to feed self;Needs set up Patient Positioning: Upright in chair Baseline Vocal Quality: Clear Volitional Cough: Strong Volitional Swallow: Able to elicit    Oral/Motor/Sensory Function Overall Oral Motor/Sensory Function: Appears within functional limits for tasks assessed   Ice Chips Ice chips: Not tested   Thin Liquid Thin Liquid: Within functional limits Presentation: Cup;Straw    Nectar Thick Nectar Thick Liquid: Not tested   Honey Thick Honey Thick Liquid: Not tested   Puree Puree: Within functional limits Presentation: Spoon   Solid   GO    Solid: Impaired Presentation: Self Fed Oral Phase Impairments: Other (comment) (No lower teeth (dentures at home).Anne Hahn, Lawson Fiscal T 03/01/2012,5:24 PM

## 2012-03-02 DIAGNOSIS — F411 Generalized anxiety disorder: Secondary | ICD-10-CM

## 2012-03-02 DIAGNOSIS — K219 Gastro-esophageal reflux disease without esophagitis: Secondary | ICD-10-CM

## 2012-03-02 LAB — MAGNESIUM: Magnesium: 1.9 mg/dL (ref 1.5–2.5)

## 2012-03-02 LAB — BASIC METABOLIC PANEL
Chloride: 99 mEq/L (ref 96–112)
GFR calc Af Amer: 22 mL/min — ABNORMAL LOW (ref >90)
GFR calc non Af Amer: 19 mL/min — ABNORMAL LOW (ref >90)
Potassium: 4.2 mEq/L (ref 3.5–5.1)
Sodium: 138 mEq/L (ref 135–145)

## 2012-03-02 MED ORDER — PANTOPRAZOLE SODIUM 40 MG PO TBEC
40.0000 mg | DELAYED_RELEASE_TABLET | Freq: Two times a day (BID) | ORAL | Status: DC
  Administered 2012-03-02 – 2012-03-04 (×4): 40 mg via ORAL
  Filled 2012-03-02 (×4): qty 1

## 2012-03-02 MED ORDER — ENOXAPARIN SODIUM 30 MG/0.3ML ~~LOC~~ SOLN
30.0000 mg | SUBCUTANEOUS | Status: DC
  Administered 2012-03-02 – 2012-03-03 (×2): 30 mg via SUBCUTANEOUS
  Filled 2012-03-02 (×3): qty 0.3

## 2012-03-02 MED ORDER — ISOSORB DINITRATE-HYDRALAZINE 20-37.5 MG PO TABS
1.0000 | ORAL_TABLET | Freq: Two times a day (BID) | ORAL | Status: DC
  Administered 2012-03-02 (×2): 1 via ORAL
  Filled 2012-03-02 (×4): qty 1

## 2012-03-02 MED ORDER — FUROSEMIDE 40 MG PO TABS
40.0000 mg | ORAL_TABLET | Freq: Two times a day (BID) | ORAL | Status: DC
  Administered 2012-03-02 – 2012-03-04 (×5): 40 mg via ORAL
  Filled 2012-03-02 (×7): qty 1

## 2012-03-02 NOTE — Progress Notes (Signed)
Brief Nutrition Note:   Pulled to the pt for 4 on Malnutrition Screening Tool.   Pt states that her appetite has been good/at baseline and that she has not lost weight recently.  Weight history shows a 14 pound weight loss since 02/12/2012 (8% body weight), significant. Pt currently weighs 151 lbs, 152 lbs in August. Weight fluctuation likely due to fluid retention.  Pt states she has been eating well and does not wish to have nutrition intervention.   Pt currently on a Heart Healthy Diet and BMI of 22.4, Normal  No intervention will be used at this time. Please consult RD if nutrition intervention is warranted.       Trenton Gammon Dietetic Intern # [phone]

## 2012-03-02 NOTE — Progress Notes (Signed)
Speech Language Pathology Dysphagia Treatment Patient Details Name: Lori Fox MRN: [id number] DOB: 1940-05-20 Today's Date: 03/02/2012 Time: 1320-1340 SLP Time Calculation (min): 20 min  Assessment / Plan / Recommendation Clinical Impression  Pt now has dentures, and reports having no difficulties chewing or swallowing. No observed difficulty chewing graham cracker. No overt s/s aspiration with solid or thin liquid consistency. Pt indicates she does not eat much meat, but it would be helpful to have meats chopped for energy conservation.    Diet Recommendation  Initiate / Change Diet: Dysphagia 3 (mechanical soft);Other (comment);Thin liquid (chop meats)    SLP Plan Continue with current plan of care   Pertinent Vitals/Pain No pain reported   Swallowing Goals  SLP Swallowing Goals Patient will consume recommended diet without observed clinical signs of aspiration with: Supervision/safety;Set-up Swallow Study Goal #1 - Progress: Progressing toward goal  General Temperature Spikes Noted: No Respiratory Status: Supplemental O2 delivered via (comment) ([place]) Behavior/Cognition: Alert;Cooperative Oral Cavity - Dentition: Dentures, top Patient Positioning: Upright in chair  Oral Cavity - Oral Hygiene Does patient have any of the following "at risk" factors?: Oxygen therapy - cannula, mask, simple oxygen devices Brush patient's teeth BID with toothbrush (using toothpaste with fluoride): Yes Patient is AT RISK - Oral Care Protocol followed (see row info): Yes   Dysphagia Treatment Treatment focused on: Skilled observation of diet tolerance;Upgraded PO texture trials;Patient/family/caregiver education Family/Caregiver Educated: caregiver Treatment Methods/Modalities: Skilled observation;Differential diagnosis Patient observed directly with PO's: Yes Type of PO's observed: Dysphagia 3 (soft);Thin liquids Feeding: Needs set up;Needs assist Liquids provided via: Straw Type of cueing:  Verbal Amount of cueing: Minimal   Lori Fox B. Murvin [facility], CCC-SLP [phone]  Lori Fox 03/02/2012, 3:04 PM

## 2012-03-02 NOTE — Clinical Social Work Placement (Addendum)
    Clinical Social Work Department CLINICAL SOCIAL WORK PLACEMENT NOTE 03/02/2012  Patient:  Lori Fox, Lori Fox  Account Number:  [id number] Admit date:  02/29/2012  Clinical Social Worker:  Lupita Leash Kriste Broman, LCSWA  Date/time:  03/02/2012 04:46 PM  Clinical Social Work is seeking post-discharge placement for this patient at the following level of care:   SKILLED NURSING   (*CSW will update this form in Epic as items are completed)   03/02/2012  Patient/family provided with [facility] Department of Clinical Social Work's list of facilities offering this level of care within the geographic area requested by the patient (or if unable, by the patient's family).  03/02/2012  Patient/family informed of their freedom to choose among providers that offer the needed level of care, that participate in Medicare, Medicaid or managed care program needed by the patient, have an available bed and are willing to accept the patient.  03/02/2012  Patient/family informed of [facility]' ownership interest in [facility], as well as of the fact that they are under no obligation to receive care at this facility.  PASARR submitted to EDS on 03/02/12 PASARR number received from EDS on 03/02/12   FL2 transmitted to all facilities in geographic area requested by pt/family on  03/02/2012 FL2 transmitted to all facilities within larger geographic area on   Patient informed that his/her managed care company has contracts with or will negotiate with  certain facilities, including the following:   NA     Patient/family informed of bed offers received: 03/03/12 Patient [facility] bed [facility]  [place] Patient to be transferred to facility by Ambulance  Sharin Mons)  The following physician request were entered in Epic:   Additional Comments: Ok per MD for d/c today to the [facility] which was choice of patient and caregiver. Not[facility]  [facility], [facility].  They are pleased with d/c.  No further CSW needs identified. CSW signing off.

## 2012-03-02 NOTE — Plan of Care (Signed)
Problem: Phase II Progression Outcomes Goal: Walk in hall or up in chair TID Outcome: Completed/Met Date Met:  03/02/12 Pt up in chair and tolerating it well.

## 2012-03-02 NOTE — Plan of Care (Signed)
Problem: Phase I Progression Outcomes Goal: EF % per last Echo/documented,Core Reminder form on chart Outcome: Completed/Met Date Met:  03/02/12 Pt EF 55-60% per echo on 02/18/12

## 2012-03-02 NOTE — Progress Notes (Signed)
Clinical Social Work Department CLINICAL SOCIAL WORK PSYCHIATRY SERVICE LINE ASSESSMENT 03/02/2012  Patient:  Lori Fox  Account:  [id number]  Admit Date:  02/29/2012  Clinical Social Worker:  Unk Lightning, LCSW  Date/Time:  03/02/2012 04:00 PM Referred by:  Physician  Date referred:  03/02/2012 Reason for Referral  Competency/Guardianship   Presenting Symptoms/Problems (In the person's/family's own words):   Psych consulted to determine capacity   Abuse/Neglect/Trauma History (check all that apply)  Denies history   Abuse/Neglect/Trauma Comments:   Psychiatric History (check all that apply)  Denies history   Psychiatric medications:  Xanax, Lexapro   Current Mental Health Hospitalizations/Previous Mental Health History:   Patient denies MH diagnosis   Current provider:   N/A   Place and Date:   N/A   Current Medications:   acetaminophen, albuterol, alum & mag hydroxide-simeth, ondansetron (ZOFRAN) IV, zolpidem                        . ALPRAZolam  0.5 mg Oral BID  . amLODipine  10 mg Oral Daily  . aspirin EC  81 mg Oral Daily  . atorvastatin  10 mg Oral q1800  . carvedilol  25 mg Oral BID WC  . cloNIDine  0.1 mg Oral TID  . clopidogrel  75 mg Oral Daily  . enoxaparin (LOVENOX) injection  30 mg Subcutaneous Q24H  . escitalopram  10 mg Oral Daily  . furosemide  40 mg Oral BID  . isosorbide-hydrALAZINE  1 tablet Oral BID  . pantoprazole  40 mg Oral BID  . potassium chloride  40 mEq Oral BID  . sodium chloride  3 mL Intravenous Q12H   Previous Impatient Admission/Date/Reason:   None reported   Emotional Health / Current Symptoms    Suicide/Self Harm  None reported   Suicide attempt in the past:   Patient denies SI or HI   Other harmful behavior:   Psychotic/Dissociative Symptoms  None reported   Other Psychotic/Dissociative Symptoms:    Attention/Behavioral Symptoms  Withdrawn   Other Attention / Behavioral Symptoms:    Cognitive Impairment    Within Normal Limits   Other Cognitive Impairment:    Mood and Adjustment  Flat    Stress, Anxiety, Trauma, Any Recent Loss/Stressor  None reported   Anxiety (frequency):   N/A   Phobia (specify):   N/A   Compulsive behavior (specify):   N/A   Obsessive behavior (specify):   N/A   Other:   N/A   Substance Abuse/Use  None   SBIRT completed (please refer for detailed history):  N  Self-reported substance use:   Patient denies all substance use   Urinary Drug Screen Completed:  N Alcohol level:    Environmental/Housing/Living Arrangement  Stable housing   Who is in the home:   Husband   Emergency contact:  Peggy-sister   Financial  Medicare   Patient's Strengths and Goals (patient's own words):   Patient reports husband is supportive   Clinical Social Worker's Interpretive Summary:   CSW received referral to complete psychosocial assessment. CSW reviewed chart and met with patient at bedside. No visitors present.    CSW introduced myself and explained role. Patient sitting in chair and watching tv. Patient reports that she is married and has one child. Patient reports she was living at home prior to admission but has been told she needs SNF at dc. CSW and patient discussed SNF options and discussed patient's feelings regarding SNF. Patient  is concerned that she will have to stay at SNF LT. CSW explained that this was a ST stay and that she would need to participate with therapy in order to regain her strength. Patient reports she has discussed plans with husband and they have chosen a facility.    Patient denies any MH or SA. Patient denies any current SI/HI or psychotic features. Patient reports stable mood since hospitalization.    Patient engaged throughout assessment and had appropriate affect. Patient able to problem solve and appears to have used good judgment to understand that she cannot return home in this condition. Patient reports compliance with  MD recommendations and family involvement.    Per psych MD, patient agreeable to SNF and signing off unless further needs arise.   Disposition:  Psych Clinical Social Worker signing off

## 2012-03-02 NOTE — Progress Notes (Signed)
Paged regarding prt's BP113/39, and rechecked BP  128/50 to Dr. Benn Moulder made aware on arrival to floor and pt asymptomatic.  MD acknowledged that was ok & asymptomatic, &  meds  have been adjusted.  Will continue to monitor.  Amanda Pea, Charity fundraiser.

## 2012-03-02 NOTE — Evaluation (Signed)
Occupational Therapy Evaluation Patient Details Name: Lori Fox MRN: [id number] DOB: December 12, 1940 Today's Date: 03/02/2012 Time: 6962-9528 OT Time Calculation (min): 24 min  OT Assessment / Plan / Recommendation Clinical Impression  Pt admitted with hypoxia and weakness.  Will benefit from acute OT services to address below problem list in prep for safe return home with 24/7 supervision/assist.      OT Assessment  Patient needs continued OT Services    Follow Up Recommendations  No OT follow up;Supervision/Assistance - 24 hour    Barriers to Discharge None    Equipment Recommendations  None recommended by OT    Recommendations for Other Services    Frequency  Min 2X/week    Precautions / Restrictions Precautions Precautions: Fall Precaution Comments: right hemiparesis Required Braces or Orthoses: Other Brace/Splint Other Brace/Splint: R AFO   Pertinent Vitals/Pain Pt 100% on 2L O2 at rest.  Pt 96-100% on RA during functional transfer and at rest.      ADL  Eating/Feeding: Performed;Independent Where Assessed - Eating/Feeding: Chair Upper Body Dressing: Performed;Set up Where Assessed - Upper Body Dressing: Unsupported sitting Lower Body Dressing: Performed;Supervision/safety Where Assessed - Lower Body Dressing: Unsupported sitting Toilet Transfer: Min guard;Simulated Toilet Transfer Method: Sit to Barista: Other (comment) (bed to chair) Equipment Used: Other (comment);Gait belt (right AFO) Transfers/Ambulation Related to ADLs: Pt ambulated ~5 ft from bed to chair with right AFO and HHA to left UE (to simulate cane).  Pt however not putting any weight through LUE during ambulation and experienced no LOB. ADL Comments: Pt donned right AFO and left shoe sitting EOB. Setup assist for donning gown due to RUE hemiparesis (pt reports she occasionally has difficulty with UB dressing).  Pt reports she stands in the shower for bathing.  Educated pt on  using 3n1 as a shower chair to help conserve energy and minimize fall risk at home.  Pt verbalized understanding.      OT Diagnosis: Generalized weakness  OT Problem List: Decreased activity tolerance;Decreased knowledge of use of DME or AE OT Treatment Interventions: Self-care/ADL training;DME and/or AE instruction;Energy conservation;Therapeutic activities;Patient/family education   OT Goals Acute Rehab OT Goals OT Goal Formulation: With patient Time For Goal Achievement: 03/09/12 Potential to Achieve Goals: Good ADL Goals Pt Will Transfer to Toilet: with modified independence;Ambulation;with DME;Regular height toilet ADL Goal: Toilet Transfer - Progress: Goal set today Pt Will Perform Tub/Shower Transfer: Shower transfer;with modified independence;Ambulation;with DME;Other (comment) (3n1 to use as shower chair) ADL Goal: Tub/Shower Transfer - Progress: Goal set today Miscellaneous OT Goals Miscellaneous OT Goal #1: Pt will retrieve bathing/dressing items at mod I level. OT Goal: Miscellaneous Goal #1 - Progress: Goal set today Miscellaneous OT Goal #2: Pt will independently verbalize 3 energy conservation techniques to be incorporated into ADLs. OT Goal: Miscellaneous Goal #2 - Progress: Goal set today  Visit Information  Last OT Received On: 03/02/12 Assistance Needed: +1    Subjective Data      Prior Functioning     Home Living Lives With: Friend(s) (friend is also her POA) Available Help at Discharge: Friend(s);Available 24 hours/day Type of Home: House Home Access: Stairs to enter [employer] of Steps: 2 Entrance Stairs-Rails: Right Home Layout: One level Bathroom Shower/Tub: Health visitor: Standard Home Adaptive Equipment: Bedside commode/3-in-1;Quad cane;Other (comment) (right AFO) Prior Function Level of Independence: Needs assistance Driving: No Vocation: Retired Comments: Pt reports that she always uses can and AFO in community  but occasionally ambulates in house without  them (reports no falls).  Needs occasional assist with long sleeved shirts due to right hemiparesis.  Reports she enjoys performing housekeeping tasks such as vacumming, and she and Steward Drone (friend she lives with) share responsilbilities. 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; pt reports she does not wear any kind of brace or splint Left Upper Extremity Assessment LUE ROM/Strength/Tone: WFL for tasks assessed     Mobility Bed Mobility Bed Mobility: Supine to Sit;Sitting - Scoot to Edge of Bed Supine to Sit: 5: Supervision;HOB elevated;With rails (HOB 30 degrees) Sitting - Scoot to Edge of Bed: 5: Supervision Details for Bed Mobility Assistance: increased time to reach with LUE for rails to assist in elevating trunk Transfers Transfers: Sit to Stand;Stand to Sit Sit to Stand: 5: Supervision;From bed Stand to Sit: 5: Supervision;To chair/3-in-1 Details for Transfer Assistance: supervision for safety     Shoulder Instructions     Exercise     Balance Balance Balance Assessed: Yes Static Sitting Balance Static Sitting - Balance Support: Feet supported;No upper extremity supported Static Sitting - Level of Assistance: 7: Independent Static Standing Balance Static Standing - Balance Support: Left upper extremity supported Static Standing - Level of Assistance: 5: Stand by assistance, 1-2 min   End of Session OT - End of Session Equipment Utilized During Treatment: Gait belt Activity Tolerance: Patient tolerated treatment well Patient left:  in chair;with call bell/phone within reach Nurse Communication: Mobility status  GO   03/02/2012 Cipriano Mile OTR/L Pager [phone] Office [phone]   Cipriano Mile 03/02/2012, 10:14 AM

## 2012-03-02 NOTE — Progress Notes (Signed)
[facility]                                                                                Patient Demographics  Lori Fox, is a 72 y.o. female  MWU:[id number]  VOZ:[id number]  DOB - [birth year]  Admit date - 02/29/2012  Admitting Physician Sorin Luanne Bras, MD  Outpatient Primary MD for the patient is Carrie Mew, MD  LOS - 2   Chief Complaint  Patient presents with  . Fatigue        Assessment & Plan    Principal Problem:  *Acute respiratory failure Active Problems:  ANXIETY DISORDER, ACUTE  HYPERTENSION, MALIGNANT ESSENTIAL  CEREBROVASCULAR DISEASE LATE EFFECTS, NOS  PERIPHERAL VASCULAR DISEASE  CVA (cerebral infarction)  Hypokalemia  Anemia  Acute on chronic diastolic CHF (congestive heart failure), NYHA class 4  CKD (chronic kidney disease) stage 3, GFR 30-59 ml/min  HPI Patient with recent history of CVA causing right upper extremity weakness, who lives with a friend at home who is also her power of attorney, was also recently hospitalized to hypertension hospital for shortness of breath due to committed for pneumonia and diastolic CHF, she was discharged on the 11th of this month, however she presented again to our hospital for shortness of breath likely due to another episode of acute on chronic diastolic CHF and elevated blood pressures. I have reviewed her chart from [facility] patient had a negative VQ scan, echo showing an EF of 60% with diastolic CHF. Upon admission there was question about placement in her capacity evaluation, psych saw the patient and have decided that patient is able to make her decision and at this time she is agreeable to going to a rehabilitation if need be.  A/P 1. Acute hypoxic respiratory failure - due to acute on chronic diastolic CHF along with hypertensive urgency , her recent echo shows EF 60% and diastolic dysfunction done in January 2014 echo done at [facility] - patient is already shown  remarkable improvement with diuresis. Will transition lasix to PO, titrate Oxygen to RA. continue beta blocker, start BIDIL, continue catapres. Clinically much improved. V/Q scan and also LE dopplers negative for acute abnormalities.  2. Recent CVA with right upper extremity weakness: continue Plavix and statin. No acute issues. Of note patient also has partial foot of dislocation and wears a brace in that leg from before.  3. Hypertension poorly controlled: will add bidil, continue lasix (change to PO), continue Coreg and Catapres. will monitor trend.  4. Dyslipidemia: continue statin.  5. Questionable dementia, capacity to take decisions. Outpatient evaluation for dementia post discharge. Due to deconditioning and assistance, and as per patient lack of 24/7 supervision might need SNF at discharge  6. Hypokalemia repleted. Will follow electrolytes and replete as needed.  7. Acute on Chronic kidney disease stage III baseline creatinine 1.3.. Cr currently 2.4, most likely secondary to acute diuresis. Lasix to be changed to PO today. Will follow BMET in am.    Code Status: full  Family Communication: patient  Disposition Plan: medically stable in in 1-2 days to be discharged either home with [facility] services or SNF.   Procedures V/Q -  Leg [place]   Consults  Psych   DVT Prophylaxis  Lovenox    Lab Results  Component Value Date   PLT 292 03/01/2012    Medications  Scheduled Meds:    . ALPRAZolam  0.5 mg Oral BID  . amLODipine  10 mg Oral Daily  . aspirin EC  81 mg Oral Daily  . atorvastatin  10 mg Oral q1800  . carvedilol  25 mg Oral BID WC  . cloNIDine  0.1 mg Oral TID  . clopidogrel  75 mg Oral Daily  . enoxaparin (LOVENOX) injection  40 mg Subcutaneous Q24H  . escitalopram  10 mg Oral Daily  . furosemide  40 mg Oral BID  . isosorbide-hydrALAZINE  1 tablet Oral BID  . pantoprazole  40 mg Oral BID  . potassium chloride  40 mEq Oral BID  . sodium chloride  3 mL Intravenous  Q12H   Continuous Infusions:  PRN Meds:.acetaminophen, albuterol, alum & mag hydroxide-simeth, ondansetron (ZOFRAN) IV, zolpidem  Antibiotics    Anti-infectives    None       Time Spent in minutes   >30 minutes   Jailen Lung M.D on 03/02/2012 at 9:27 AM  Between 7am to 7pm - Pager - [phone]  After 7pm go to [email or web address] - password [email or web address]  And look for the night coverage person covering for me after hours     Subjective:   Cassandr Fox today is feeling a lot better, no CP, no SOB, no abdominal pain and is afebrile.  Objective:   Filed Vitals:   03/01/12 1534 03/01/12 2111 03/01/12 2207 03/02/12 0441  BP: 159/46 136/46 139/43 155/53  Pulse: 69 57 61 64  Temp: 97.8 F (36.6 C)  97.5 F (36.4 C) 97.6 F (36.4 C)  TempSrc: Oral  Oral Oral  Resp: 18  18 18   Height:      Weight:    68.72 kg (151 lb 8 oz)  SpO2: 100%  99% 98%    Wt Readings from Last 3 Encounters:  03/02/12 68.72 kg (151 lb 8 oz)  02/12/12 74.844 kg (165 lb)  02/02/12 74.9 kg (165 lb 2 oz)     Intake/Output Summary (Last 24 hours) at 03/02/12 0981 Last data filed at 03/02/12 0445  Gross per 24 hour  Intake    364 ml  Output    575 ml  Net   -211 ml    Exam GEN: AAOX2; normal affect, no acute complaints Heart:no rubs, no gallops, S1 and S2 Abdomen: soft, Nt, ND, positive BS Extremities:trace edema Neuro: no new focal deficit; right side hemiparesis  Radiology Reports Dg Chest 2 View  02/29/2012  *RADIOLOGY REPORT*  Clinical Data: Shortness of breath.  CHEST - 2 VIEW  Comparison: 02/15/2012.  Findings: Trachea is midline.  Heart size normal.  Interval improvement in diffuse mixed interstitial and airspace disease, without complete resolution.  Tiny bilateral pleural effusions. Biapical pleural parenchymal scarring.  IMPRESSION: Mild congestive heart failure, improved from 02/15/2012.   Original Report Authenticated By: Leanna Battles, M.D.    Dg Chest 2 View  02/15/2012  *RADIOLOGY  REPORT*  Clinical Data: Hypertension. Shortness of breath.  CHEST - 2 VIEW  Comparison: PA and lateral chest 03/27/2003.  Findings: The patient has small bilateral pleural effusions, larger on the left.  There is patchy bilateral airspace disease appearing worst in the bases and greater on the left.  Heart size is upper normal.  IMPRESSION: Small bilateral pleural effusions and patchy airspace disease could  be due to asymmetric edema or pneumonia.   Original Report Authenticated By: Holley Dexter, M.D.    Ct Head Wo Contrast  02/01/2012  *RADIOLOGY REPORT*  Clinical Data: Spasms  CT HEAD WITHOUT CONTRAST  Technique:  Contiguous axial images were obtained from the base of the skull through the vertex without contrast.  Comparison: CT 09/22/2008  Findings: Chronic left MCA infarct is unchanged.  There is volume loss in the left with dilatation of the left lateral ventricle.  No acute infarct.  Negative for hemorrhage or mass lesion.  Mild chronic microvascular ischemia in the cerebral white matter on the right.  Negative for hemorrhage or mass.  Calvarium is intact.  IMPRESSION: Chronic left MCA infarct.  No acute abnormality.   Original Report Authenticated By: Janeece Riggers, M.D.      CBC  Lab 03/01/12 0640 02/29/12 1337  WBC 8.6 10.0  HGB 11.4* 10.6*  HCT 33.3* 30.6*  PLT 292 269  MCV 93.0 92.4  MCH 31.8 32.0  MCHC 34.2 34.6  RDW 13.0 12.8  LYMPHSABS -- 0.5*  MONOABS -- 0.3  EOSABS -- 0.0  BASOSABS -- 0.0  BANDABS -- --    Chemistries   Lab 03/02/12 0605 03/01/12 0640 02/29/12 1337  NA 138 137 138  K 4.2 3.4* 3.3*  CL 99 95* 100  CO2 25 24 23   GLUCOSE 125* 140* 164*  BUN 21 17 19   CREATININE 2.48* 1.59* 1.55*  CALCIUM 10.5 10.5 10.7*  MG 1.9 -- --  AST -- -- 14  ALT -- -- 12  ALKPHOS -- -- 138*  BILITOT -- -- 0.6    Coagulation profile  Lab 02/29/12 1655  INR 1.20  PROTIME --     Basename 02/29/12 1655  DDIMER 2.89*

## 2012-03-02 NOTE — Progress Notes (Signed)
Agree with Dietetic Intern note as written.  Conception Doebler Kowalski RD, LDN Pager #[phone] After Hours pager #[phone]  

## 2012-03-02 NOTE — Clinical Social Work Psychosocial (Signed)
     Clinical Social Work Department BRIEF PSYCHOSOCIAL ASSESSMENT 03/02/2012  Patient:  Lori Fox, Lori Fox     Account Number:  [id number]     Admit date:  02/29/2012  Clinical Social Worker:  Tiburcio Pea  Date/Time:  03/02/2012 04:39 PM  Referred by:  Physician  Date Referred:  03/01/2012 Referred for  SNF Placement   Other Referral:   Interview type:  Other - See comment Other interview type:   Patient and caregivers/HCPOA  Steward Drone and Stephens November    PSYCHOSOCIAL DATA Living Status:  FRIEND(S) Admitted from facility:   Level of care:   Primary support name:  [name] [phone] Primary support relationship to patient:  FRIEND Degree of support available:   Steward Drone is also HCPOA. Pt has been living with Steward Drone and her husband for many years.    CURRENT CONCERNS Current Concerns  Post-Acute Placement   Other Concerns:    SOCIAL WORK ASSESSMENT / PLAN Patient referred for SNF placement. She lives at home wiht her 2 friends- Steward Drone and Mallory.  Call received from [facility] today stating that patient was not able to return home at d/c this time as they cannot manage her care at this time. They would be willing for her to return home after rehab. Steward Drone is requesting placement at [facility] in [place] as this is close to her home. Spoke with patient- she is agreeable to short term rehab and will allow bed search. Contacted Tracie- Admissions at [facility]. She is aware of patient and was awaiting referral for hopeful placement.  Fl2 placed on shadow chart for MD's signature.  CSW will assist with placement.   Assessment/plan status:  Psychosocial Support/Ongoing Assessment of Needs Other assessment/ plan:   Information/referral to community resources:   SNF list given to patient  Aftercare needs disucssed- HH/DME as needed to be arranged at d/c by SNF.    PATIENTS/FAMILYS RESPONSE TO PLAN OF CARE: Patient is alert but appears forgetful at times. She has been  agreeing and refusing SNF placement but is now agreeable to short term SNF. This is what her caregivers are requesting. Bed search is in place for SNF bed.  CSW will follow.

## 2012-03-02 NOTE — Progress Notes (Signed)
Pt's care taker's  Husband, Renae Fickle requested update notified Dr.Madera  Who is in the ED at this time and unable to speak with family.  Requested to inform him SW is  working on placement for pt.  Also Lupita Leash, SW spoke with family  at this time.  Amanda Pea, Charity fundraiser.

## 2012-03-02 NOTE — Progress Notes (Signed)
Pt HR49 & asymptomatic. Also BP at 1459 111/39, P 52 with machine.  Manually 128/50,  Message text page to Dr. Gwenlyn Perking.  Will continue to monitor.  Amanda Pea, Charity fundraiser.

## 2012-03-02 NOTE — Progress Notes (Signed)
Physical Therapy Treatment Patient Details Name: Lori Fox MRN: [id number] DOB: August 11, 1940 Today's Date: 03/02/2012 Time: 5621-3086 PT Time Calculation (min): 38 min  PT Assessment / Plan / Recommendation Comments on Treatment Session  Pt admitted with hypoxia and weakness. Chronic R hemiparesis upper more impaired than lower. Improving ambulation and activity tolerance with therapy today. Caregiver very concerned about taking patient home with multiple readmissions, pt now seems agreeable to SNF. Regardless of disposition pt will need 24 hour assist upon d/c. If family cannot manage this ST-SNF may be beneficial.     Follow Up Recommendations  Home health PT;Supervision/Assistance - 24 hour vs SNF (pending caregiver support)     Does the patient have the potential to tolerate intense rehabilitation     Barriers to Discharge        Equipment Recommendations       Recommendations for Other Services    Frequency Min 3X/week   Plan Discharge plan needs to be updated    Precautions / Restrictions Precautions Precautions: Fall Required Braces or Orthoses: Other Brace/Splint Other Brace/Splint: R AFO Restrictions Weight Bearing Restrictions: No       Mobility  Bed Mobility Bed Mobility: Not assessed Transfers Transfers: Sit to Stand;Stand to Sit Sit to Stand: 5: Supervision;From chair/3-in-1;With upper extremity assist;With armrests Stand to Sit: 5: Supervision;To chair/3-in-1;With upper extremity assist;With armrests Details for Transfer Assistance: supervision for safety Ambulation/Gait Ambulation/Gait Assistance: 4: Min guard Ambulation Distance (Feet): 400 Feet Assistive device: Straight cane Ambulation/Gait Assistance Details: cues for sequencing step pattern with cane, increased lateral sway with 2-3 lateral staggers; fatigued towards the end of ambuation with increasign sway as she grew more tired Gait Pattern: Step-to pattern;Trunk flexed;Decreased stride length        PT Goals Acute Rehab PT Goals PT Goal: Sit to Stand - Progress: Progressing toward goal PT Goal: Stand to Sit - Progress: Progressing toward goal PT Goal: Ambulate - Progress: Progressing toward goal  Visit Information  Last PT Received On: 03/02/12 Assistance Needed: +1    Subjective Data  Subjective: I need depends. Patient Stated Goal: rehab prior to home   Cognition  Overall Cognitive Status: Impaired Arousal/Alertness: Awake/alert Orientation Level: Appears intact for tasks assessed Behavior During Session: [facility] for tasks performed Cognition - Other Comments: slow processing, mixes up her words at times    Balance  Static Standing Balance Static Standing - Balance Support: No upper extremity supported Static Standing - Level of Assistance: 5: Stand by assistance Static Standing - Comment/# of Minutes: pt standing at sink to wash her hands needing minA to bring LUE to RUE during waching, mingaurdA for safety/stability, tends to lean against counter with  weight shifted left; pt able to pull up her depends sitting in the chair and standing with adequate balance and mingaurdA  End of Session PT - End of Session Equipment Utilized During Treatment: Gait belt Activity Tolerance: Patient tolerated treatment well Patient left: in chair;with call bell/phone within reach;with family/visitor present Nurse Communication: Mobility status   GP     [facility] HELEN 03/02/2012, 3:08 PM

## 2012-03-03 DIAGNOSIS — I6350 Cerebral infarction due to unspecified occlusion or stenosis of unspecified cerebral artery: Secondary | ICD-10-CM

## 2012-03-03 LAB — BASIC METABOLIC PANEL
Chloride: 98 mEq/L (ref 96–112)
GFR calc Af Amer: 15 mL/min — ABNORMAL LOW (ref >90)
GFR calc non Af Amer: 13 mL/min — ABNORMAL LOW (ref >90)
Potassium: 5.7 mEq/L — ABNORMAL HIGH (ref 3.5–5.1)

## 2012-03-03 MED ORDER — ISOSORB DINITRATE-HYDRALAZINE 20-37.5 MG PO TABS
1.0000 | ORAL_TABLET | Freq: Three times a day (TID) | ORAL | Status: DC
  Administered 2012-03-03 – 2012-03-04 (×4): 1 via ORAL
  Filled 2012-03-03 (×6): qty 1

## 2012-03-03 NOTE — Progress Notes (Signed)
[facility]                                                                                Patient Demographics  Lori Fox, is a 72 y.o. female  ZOX:[id number]  [id number]  DOB - 06/20/1940  Admit date - 02/29/2012  Admitting Physician Sorin Luanne Bras, MD  Outpatient Primary MD for the patient is Lori Mew, MD  LOS - 3   Chief Complaint  Patient presents with  . Fatigue        Assessment & Plan    Principal Problem:  *Acute respiratory failure Active Problems:  ANXIETY DISORDER, ACUTE  HYPERTENSION, MALIGNANT ESSENTIAL  CEREBROVASCULAR DISEASE LATE EFFECTS, NOS  PERIPHERAL VASCULAR DISEASE  CVA (cerebral infarction)  Hypokalemia  Anemia  Acute on chronic diastolic CHF (congestive heart failure), NYHA class 4  CKD (chronic kidney disease) stage 3, GFR 30-59 ml/min  HPI Patient with recent history of CVA causing right upper extremity weakness, who lives with a friend at home who is also her power of attorney, was also recently hospitalized to hypertension hospital for shortness of breath due to committed for pneumonia and diastolic CHF, she was discharged on the 11th of this month, however she presented again to our hospital for shortness of breath likely due to another episode of acute on chronic diastolic CHF and elevated blood pressures. I have reviewed her chart from [facility] patient had a negative VQ scan, echo showing an EF of 60% with diastolic CHF. Upon admission there was question about placement in her capacity evaluation, psych saw the patient and have decided that patient is able to make her decision and at this time she is agreeable to going to a rehabilitation if need be.  A/P 1. Acute hypoxic respiratory failure - due to acute on chronic diastolic CHF along with hypertensive urgency , her recent echo shows EF 60% and diastolic dysfunction done in January 2014 echo done at [facility] - patient is already shown  remarkable improvement with diuresis. Will transition lasix to PO, titrate Oxygen to RA. continue beta blocker, continue BIDIL, continue catapres. Clinically much improved. V/Q scan and also LE dopplers negative for acute abnormalities.  2. Recent CVA with right upper extremity weakness: continue Plavix and statin. No acute issues. Of note patient also has partial foot dislocation and wears a brace in that leg a baseline.  3. Hypertension poorly controlled: will continue bidil, continue lasix (change to PO), continue Coreg and Catapres. will monitor trend. Better.  4. Dyslipidemia: continue statin.  5. Questionable dementia: capacity to take decisions. Outpatient evaluation for dementia post discharge. Due to deconditioning and assistance, and as per patient lack of 24/7 supervision; will need SNF at discharge  6. Hypokalemia: repleted. Will follow electrolytes and replete as needed.  7. Acute on Chronic kidney disease stage III: baseline creatinine 1.3.. Cr worsening, currently 3.4, most likely secondary to acute diuresis. Lasix changed to PO on 1/22. Will follow BMET in am. Will need Cr stable or trending down prior to safely discharge. No other nephrotoxic agent identified. Will check UA  8. Bradycardia: will discontinue amlodipine; as she is also on beta blockers. Follow VS.  Code Status: full  Family Communication: patient  Disposition Plan: medically stable in in 1-2 days to SNF.   Procedures V/Q - Leg [place]   Consults  Psych   DVT Prophylaxis  Lovenox    Lab Results  Component Value Date   PLT 292 03/01/2012    Medications  Scheduled Meds:    . ALPRAZolam  0.5 mg Oral BID  . aspirin EC  81 mg Oral Daily  . atorvastatin  10 mg Oral q1800  . carvedilol  25 mg Oral BID WC  . cloNIDine  0.1 mg Oral TID  . clopidogrel  75 mg Oral Daily  . enoxaparin (LOVENOX) injection  30 mg Subcutaneous Q24H  . escitalopram  10 mg Oral Daily  . furosemide  40 mg Oral BID  .  isosorbide-hydrALAZINE  1 tablet Oral TID  . pantoprazole  40 mg Oral BID  . sodium chloride  3 mL Intravenous Q12H   Continuous Infusions:  PRN Meds:.acetaminophen, albuterol, alum & mag hydroxide-simeth, ondansetron (ZOFRAN) IV, zolpidem  Antibiotics    Anti-infectives    None       Time Spent in minutes   > 30 minutes   Lori Fox M.D on 03/03/2012 at 10:52 AM  Between 7am to 7pm - Pager - [phone]  After 7pm go to [email or web address] - password [email or web address]  And look for the night coverage person covering for me after hours     Subjective:   Lori Fox today is not having any acute complaints; patient with good urine output and currently w/o SOB.   Objective:   Filed Vitals:   03/02/12 1728 03/02/12 2108 03/02/12 2306 03/03/12 0513  BP: 138/48 144/50 130/46 124/51  Pulse: 58 54 52 51  Temp:    97.2 F (36.2 C)  TempSrc:    Oral  Resp:    18  Height:      Weight:    68.221 kg (150 lb 6.4 oz)  SpO2:    97%    Wt Readings from Last 3 Encounters:  03/03/12 68.221 kg (150 lb 6.4 oz)  02/12/12 74.844 kg (165 lb)  02/02/12 74.9 kg (165 lb 2 oz)     Intake/Output Summary (Last 24 hours) at 03/03/12 1052 Last data filed at 03/03/12 0912  Gross per 24 hour  Intake   1160 ml  Output    525 ml  Net    635 ml    Exam GEN: AAOX2; normal affect, no acute complaints Heart:no rubs, no gallops, S1 and S2 Abdomen: soft, Nt, ND, positive BS Extremities:trace edema Neuro: no new focal deficit; right side hemiparesis unchanged from baseline.  Radiology Reports Dg Chest 2 View  02/29/2012  *RADIOLOGY REPORT*  Clinical Data: Shortness of breath.  CHEST - 2 VIEW  Comparison: 02/15/2012.  Findings: Trachea is midline.  Heart size normal.  Interval improvement in diffuse mixed interstitial and airspace disease, without complete resolution.  Tiny bilateral pleural effusions. Biapical pleural parenchymal scarring.  IMPRESSION: Mild congestive heart failure, improved from  02/15/2012.   Original Report Authenticated By: Leanna Battles, M.D.    Dg Chest 2 View  02/15/2012  *RADIOLOGY REPORT*  Clinical Data: Hypertension. Shortness of breath.  CHEST - 2 VIEW  Comparison: PA and lateral chest 03/27/2003.  Findings: The patient has small bilateral pleural effusions, larger on the left.  There is patchy bilateral airspace disease appearing worst in the bases and greater on the left.  Heart size is upper normal.  IMPRESSION: Small bilateral pleural effusions  and patchy airspace disease could be due to asymmetric edema or pneumonia.   Original Report Authenticated By: Holley Dexter, M.D.    Ct Head Wo Contrast  02/01/2012  *RADIOLOGY REPORT*  Clinical Data: Spasms  CT HEAD WITHOUT CONTRAST  Technique:  Contiguous axial images were obtained from the base of the skull through the vertex without contrast.  Comparison: CT 09/22/2008  Findings: Chronic left MCA infarct is unchanged.  There is volume loss in the left with dilatation of the left lateral ventricle.  No acute infarct.  Negative for hemorrhage or mass lesion.  Mild chronic microvascular ischemia in the cerebral white matter on the right.  Negative for hemorrhage or mass.  Calvarium is intact.  IMPRESSION: Chronic left MCA infarct.  No acute abnormality.   Original Report Authenticated By: Janeece Riggers, M.D.      CBC  Lab 03/01/12 0640 02/29/12 1337  WBC 8.6 10.0  HGB 11.4* 10.6*  HCT 33.3* 30.6*  PLT 292 269  MCV 93.0 92.4  MCH 31.8 32.0  MCHC 34.2 34.6  RDW 13.0 12.8  LYMPHSABS -- 0.5*  MONOABS -- 0.3  EOSABS -- 0.0  BASOSABS -- 0.0  BANDABS -- --    Chemistries   Lab 03/03/12 0500 03/02/12 0605 03/01/12 0640 02/29/12 1337  NA 133* 138 137 138  K 5.7* 4.2 3.4* 3.3*  CL 98 99 95* 100  CO2 26 25 24 23   GLUCOSE 104* 125* 140* 164*  BUN 27* 21 17 19   CREATININE 3.43* 2.48* 1.59* 1.55*  CALCIUM 10.0 10.5 10.5 10.7*  MG -- 1.9 -- --  AST -- -- -- 14  ALT -- -- -- 12  ALKPHOS -- -- -- 138*    BILITOT -- -- -- 0.6    Coagulation profile  Lab 02/29/12 1655  INR 1.20  PROTIME --     Basename 02/29/12 1655  DDIMER 2.89*

## 2012-03-03 NOTE — Progress Notes (Signed)
Bed offer received and accepted by patient and caregive- [name] for [facility].  Per MD- patient is not yet stable for d/c.  Possibly tomorrow. CSW will monitor. Mrs. Lori Fox and patient are pleased with this plan.  Lorri Frederick. [place]  [phone]

## 2012-03-03 NOTE — Progress Notes (Addendum)
Occupational Therapy Treatment Patient Details Name: Lori Fox MRN: [id number] DOB: October 08, 1940 Today's Date: 03/03/2012 Time: 4098-1191 OT Time Calculation (min): 23 min  OT Assessment / Plan / Recommendation Comments on Treatment Session This 72 yo making progress since eval. Will benefit from OT at [facility].    Follow Up Recommendations  SNF       Equipment Recommendations  None recommended by OT       Frequency Min 2X/week   Plan Discharge plan needs to be updated    Precautions / Restrictions Precautions Precautions: Fall Precaution Comments: rt hemiparesis, transmet amp RLE Required Braces or Orthoses: Other Brace/Splint Other Brace/Splint: RLE AFO Restrictions Weight Bearing Restrictions: No       ADL  Grooming: Performed;Denture care;Brushing hair;Min guard Where Assessed - Grooming: Unsupported standing Lower Body Dressing: Supervision/safety Where Assessed - Lower Body Dressing: Unsupported sit to stand Toilet Transfer: Performed;Min guard Toilet Transfer Method: Sit to Barista: Regular height toilet;Grab bars Transfers/Ambulation Related to ADLs: Min guard A for all (no AFO on) ADL Comments: Issued patient handout on energy conservation and went over that is safet and uses less energy to sit and do something than to stand. If she tends to get tired when she follows bathing immediately with dressing then she may do better if she baths, rests, then dresses. Talked about sitting to shower and not use hot steamy water because it can affect her breathing and endurance. She verbalized understanding of the things that we talked about     OT Goals ADL Goals Pt Will Perform Grooming: with supervision;Unsupported;Standing at sink ADL Goal: Grooming - Progress: Goal set today ADL Goal: Toilet Transfer - Progress: Not progressing (Remains at Mn guard A level today for this) Miscellaneous OT Goals OT Goal: Miscellaneous Goal #1 - Progress: Not  progressing (Still Min guard A this PM when about on her feet) OT Goal: Miscellaneous Goal #2 - Progress: Progressing toward goals  Visit Information  Last OT Received On: 03/03/12 Assistance Needed: +1    Subjective Data  Subjective: I am cold (turned up heat for her and had her put on her PJ bottoms)      Cognition  Overall Cognitive Status: Impaired Arousal/Alertness: Awake/alert Orientation Level: Disoriented to;Time Behavior During Session: [facility] for tasks performed    Mobility   Bed Mobility Bed Mobility: Sit to Supine Supine to Sit: 6: Modified independent (Device/Increase time);With rails;HOB elevated (30 degrees) Sitting - Scoot to Edge of Bed: 6: Modified independent (Device/Increase time);With rail Sit to Supine: 6: Modified independent (Device/Increase time);HOB flat Transfers Transfers: Sit to Stand;Stand to Sit Sit to Stand: 5: Supervision;From chair/3-in-1;With armrests Stand to Sit: 5: Supervision;To bed Details for Transfer Assistance: armrest to stand from chair on left with supervision for safety             End of Session OT - End of Session Equipment Utilized During Treatment:  (None) Activity Tolerance: Patient tolerated treatment well Patient left: in bed;with call bell/phone within reach;with bed alarm set       Evette Georges [phone] 03/03/2012, 3:10 PM

## 2012-03-03 NOTE — Progress Notes (Signed)
Pt HR on monitor down to 47 non-sustaining. HR came back up to 52, pulse 52 on palpation. BP 130/46 manual. MD notified via text page. No new orders. Jobe Igo A RN 03/02/2012

## 2012-03-03 NOTE — Progress Notes (Signed)
Physical Therapy Treatment Patient Details Name: Lori Fox MRN: [id number] DOB: 1940-09-15 Today's Date: 03/03/2012 Time: 4098-1191 PT Time Calculation (min): 27 min  PT Assessment / Plan / Recommendation Comments on Treatment Session  Pt admitted wtih hypoxia and weakness, and h/o rt hemiparesis. Pt with continued improvement in mobility and ambulation with assist of pt wearing depends today. Per pt caregiver is still wanting ST-SNF for maximum independence to decrease burden of care prior to return home. Continue to recommend supervision for mobility and assist as needed.     Follow Up Recommendations        Does the patient have the potential to tolerate intense rehabilitation     Barriers to Discharge        Equipment Recommendations       Recommendations for Other Services    Frequency     Plan Discharge plan remains appropriate;Frequency remains appropriate    Precautions / Restrictions Precautions Precautions: Fall Precaution Comments: rt hemiparesis, transmet amp RLE Required Braces or Orthoses: Other Brace/Splint Other Brace/Splint: RLE AFO Restrictions Weight Bearing Restrictions: No   Pertinent Vitals/Pain No pain    Mobility  Bed Mobility Bed Mobility: Sit to Supine Supine to Sit: 6: Modified independent (Device/Increase time);With rails;HOB elevated (30 degrees) Sitting - Scoot to Edge of Bed: 6: Modified independent (Device/Increase time);With rail Sit to Supine: 6: Modified independent (Device/Increase time);HOB flat Transfers Sit to Stand: 5: Supervision;From chair/3-in-1;With armrests Stand to Sit: 5: Supervision;To bed Details for Transfer Assistance: armrest to stand from chair on left with supervision for safety Ambulation/Gait Ambulation/Gait Assistance: 4: Min guard Ambulation Distance (Feet): 450 Feet Assistive device: Straight cane;Other (Comment) (AFO) Ambulation/Gait Assistance Details: Pt supervision level through majority of  ambulation other than when turning head to look at other staff with partial LOB in need of guarding but able to correct without physical assist Gait Pattern: Step-to pattern Gait velocity: decreased    Exercises General Exercises - Lower Extremity Long Arc Quad: AROM;Left;20 reps;Seated Hip Flexion/Marching: AROM;Left;15 reps;Seated (pt denied attempting HEP with RLE today)   PT Diagnosis:    PT Problem List:   PT Treatment Interventions:     PT Goals Acute Rehab PT Goals PT Goal: Sit to Supine/Side - Progress: Met Pt will go Sit to Stand: with modified independence PT Goal: Sit to Stand - Progress: Updated due to goal met Pt will go Stand to Sit: with modified independence PT Goal: Stand to Sit - Progress: Updated due to goals met PT Goal: Ambulate - Progress: Progressing toward goal PT Goal: Up/Down Stairs - Progress: Progressing toward goal  Visit Information  Last PT Received On: 03/03/12 Assistance Needed: +1    Subjective Data  Subjective: I'm just cold   Cognition  Overall Cognitive Status: Impaired Arousal/Alertness: Awake/alert Orientation Level: Disoriented to;Time Behavior During Session: [facility] for tasks performed    Balance  Static Sitting Balance Static Sitting - Balance Support: Feet supported;No upper extremity supported Static Sitting - Level of Assistance: 7: Independent Static Sitting - Comment/# of Minutes: 3 min with and without back support able to don and doff AFO and shoe unassisted  End of Session PT - End of Session Equipment Utilized During Treatment: Gait belt;Right ankle foot orthosis Activity Tolerance: Patient tolerated treatment well Patient left: in bed;with call bell/phone within reach Nurse Communication: Mobility status   GP     Delorse Lek 03/03/2012, 3:16 PM Delaney Meigs, PT [phone]

## 2012-03-04 DIAGNOSIS — E785 Hyperlipidemia, unspecified: Secondary | ICD-10-CM

## 2012-03-04 DIAGNOSIS — R0902 Hypoxemia: Secondary | ICD-10-CM

## 2012-03-04 LAB — BASIC METABOLIC PANEL
BUN: 31 mg/dL — ABNORMAL HIGH (ref 6–23)
Chloride: 97 mEq/L (ref 96–112)
Creatinine, Ser: 3.39 mg/dL — ABNORMAL HIGH (ref 0.50–1.10)
GFR calc Af Amer: 15 mL/min — ABNORMAL LOW (ref >90)
GFR calc non Af Amer: 13 mL/min — ABNORMAL LOW (ref >90)
Potassium: 5 mEq/L (ref 3.5–5.1)

## 2012-03-04 LAB — URINALYSIS, ROUTINE W REFLEX MICROSCOPIC
Ketones, ur: NEGATIVE mg/dL
Leukocytes, UA: NEGATIVE
Nitrite: NEGATIVE
Specific Gravity, Urine: 1.01 (ref 1.005–1.030)
Urobilinogen, UA: 0.2 mg/dL (ref 0.0–1.0)
pH: 5.5 (ref 5.0–8.0)

## 2012-03-04 MED ORDER — ATORVASTATIN CALCIUM 10 MG PO TABS: 10.0000 mg | ORAL_TABLET | Freq: Every day | ORAL | Status: DC

## 2012-03-04 MED ORDER — CLONIDINE HCL 0.2 MG PO TABS: 0.1000 mg | ORAL_TABLET | Freq: Three times a day (TID) | ORAL | Status: DC

## 2012-03-04 MED ORDER — ISOSORB DINITRATE-HYDRALAZINE 20-37.5 MG PO TABS: 1.0000 | ORAL_TABLET | Freq: Three times a day (TID) | ORAL | Status: AC

## 2012-03-04 MED ORDER — ESOMEPRAZOLE MAGNESIUM 40 MG PO CPDR: 40.0000 mg | DELAYED_RELEASE_CAPSULE | Freq: Every day | ORAL | Status: DC

## 2012-03-04 MED ORDER — ALPRAZOLAM 0.5 MG PO TABS: 0.5000 mg | ORAL_TABLET | Freq: Two times a day (BID) | ORAL | Status: DC

## 2012-03-04 MED ORDER — FUROSEMIDE 40 MG PO TABS: 40.0000 mg | ORAL_TABLET | Freq: Two times a day (BID) | ORAL | Status: DC

## 2012-03-04 NOTE — Discharge Summary (Signed)
Physician Discharge Summary  Lori Fox:[id number] DOB: 1941/01/04 DOA: 02/29/2012  PCP: Carrie Mew, MD  Admit date: 02/29/2012 Discharge date: 03/04/2012  Time spent: >30 minutes  Recommendations for Follow-up:  Take medications as prescribed Follow a low sodium diet Keep patient hydrated Please check BMET in 1 week to follow kidney function and electrolytes Follow up with PCP in 2 weeks after discharge for SNF Physical therapy and occupational as directed for physical rehabilitation at a nursing home.  Discharge Diagnoses:  Principal Problem:  *Acute respiratory failure Active Problems:  ANXIETY DISORDER, ACUTE  HYPERTENSION, MALIGNANT ESSENTIAL  CEREBROVASCULAR DISEASE LATE EFFECTS, NOS  PERIPHERAL VASCULAR DISEASE  CVA (cerebral infarction)  Hypokalemia  Anemia  Acute on chronic diastolic CHF (congestive heart failure), NYHA class 4  CKD (chronic kidney disease) stage 3, GFR 30-59 ml/min   Discharge Condition: stable and improved. Good oxygen saturation; no CP and no SOB. Patient will be discharge to SNF for physical rehabilitation.  Diet recommendation: regular diet, thin liquids, low-sodium/heart healthy.  Filed Weights   03/02/12 0441 03/03/12 0513 03/04/12 0435  Weight: 68.72 kg (151 lb 8 oz) 68.221 kg (150 lb 6.4 oz) 70.262 kg (154 lb 14.4 oz)    History of present illness:  Patient with recent history of CVA causing right upper extremity weakness, who lives with a friend at home who is also her power of attorney, was also recently hospitalized to hypertension hospital for shortness of breath due to committed for pneumonia and diastolic CHF, she was discharged on the 11th of this month, however she presented again to our hospital for shortness of breath likely due to another episode of acute on chronic diastolic CHF and elevated blood pressures. I have reviewed her chart from [facility] patient had a negative VQ scan, echo showing an EF of 60%  with diastolic CHF. Upon admission there was question about placement in her capacity evaluation, psych saw the patient and have decided that patient is able to make her decision and at this time she is agreeable to going to a rehabilitation if need be.   Hospital Course:  1. Acute hypoxic respiratory failure - due to acute on chronic diastolic CHF along with hypertensive urgency, her recent echo shows EF 60% and diastolic dysfunction done in January 2014 echo done at [facility] - patient Demonstrated significant improvement with diuresis; and at this moment is tolerating good oxygen saturation without any supplementation. Will recommend low-sodium diet, Lasix by mouth 40 mg twice a day, continue good antihypertensive regimen using beta blocker, BiDil and Catapres.  V/Q scan and also LE dopplers negative for acute abnormalities.   2. Recent CVA with right upper extremity weakness: continue Plavix and statin. No acute issues. Of note patient also has partial foot dislocation and wears a brace in that leg a baseline. Patient will be transferred to a skilled nursing facility for physical rehabilitation.  3. Hypertension: well controlled at this moment with current regimen. Will continue the use of BiDil, Lasix, colic, and also Catapres. Further medication adjustment and reevaluation for her blood pressure to be done as an outpatient. Patient advise/instructed to follow a low-sodium diet.   4. Dyslipidemia: continue statin.   5. Questionable dementia: capacity to take decisions. Outpatient evaluation for dementia post dischargehe is recommended. Due to deconditioning and assistance, and as per patient lack of 24/7 supervision; will need SNF at discharge for physical rehabilitation and conditioning.  6. Hypokalemia: repleted And within normal limits at discharge.  7.  Acute on Chronic kidney disease stage III: baseline creatinine 1.3 2.3. Cr worsening, currently 3.3, most likely secondary to  acute diuresis and mild decreased perfusion with adjustment of her hypertension.Lasix to be continue 40 mg by mouth twice a day at this point. It is no other nephrotoxic agents on board; patient will keep herself hydrated and will follow with PCP in order to have her basic metabolic panel check and follow on her kidney function. Case has been discussed with nephrology for this point do not think that the patient require any further workup. Urinalysis prior to discharge was within normal limits and no showing any signs of infection/hematuria.  8. Bradycardia: amlodipine has been discontinue; as she is also on beta blockers. Heart rate improve after stopping amlodipine.  Rest of medical problems remains stable during hospitalization, plan is to continue current medication regimen and to follow with PCP for further evaluation, medication adjustment and treatment.   Procedures: -V/Q scan: Negative for pulmonary embolism. -Abdominal ultrasound:no obvious evidence of hemodynamically significant renal artery stenosis bilaterally. -Lower extremities Doppler bilaterally: Negative for DVT, SVT or Baker's cyst.  Consultations:  PT/OT  Discharge Exam: Filed Vitals:   03/03/12 2124 03/03/12 2151 03/04/12 0435 03/04/12 0938  BP: 153/60  137/66 139/63  Pulse: 60 61 67 60  Temp:  97.8 F (36.6 C) 96.7 F (35.9 C) 97.7 F (36.5 C)  TempSrc:  Oral Oral Oral  Resp:   20 18  Height:      Weight:   70.262 kg (154 lb 14.4 oz)   SpO2:  95% 94% 95%   GEN: AAOX2; normal affect, no acute complaints  Heart:no rubs, no gallops, S1 and S2  Abdomen: soft, Nt, ND, positive BS  Extremities:trace edema  Neuro: no new focal deficit; right side hemiparesis unchanged from baseline.   Discharge Instructions  Discharge Orders    Future Appointments: Provider: Department: Dept Phone: Center:   03/28/2012 3:45 PM Stacie Glaze, MD [facility] [phone] [facility]     Future Orders  Please Complete By Expires   Diet - low sodium heart healthy      Discharge instructions      Comments:   Take medications as prescribed Follow a low sodium diet Keep patient hydrated Please check BMET in 1 week to follow kidney function and electrolytes Follow up with PCP in 2 weeks after discharge for SNF Physical therapy and occupational as directed for physical rehabilitation at a nursing home.       Medication List     As of 03/04/2012 12:29 PM    STOP taking these medications         amLODipine 10 MG tablet   Commonly known as: NORVASC      hydrALAZINE 50 MG tablet   Commonly known as: APRESOLINE      rosuvastatin 20 MG tablet   Commonly known as: CRESTOR   Replaced by: atorvastatin 10 MG tablet      TAKE these medications         ALPRAZolam 0.5 MG tablet   Commonly known as: XANAX   Take 1 tablet (0.5 mg total) by mouth 2 (two) times daily.      aspirin 81 MG chewable tablet   Chew 362 mg by mouth once.      atorvastatin 10 MG tablet   Commonly known as: LIPITOR   Take 1 tablet (10 mg total) by mouth daily at 6 PM.      carvedilol 25 MG tablet  Commonly known as: COREG   TAKE 1 TABLET TWICE DAILY      cloNIDine 0.2 MG tablet   Commonly known as: CATAPRES   Take 0.5 tablets (0.1 mg total) by mouth 3 (three) times daily.      clopidogrel 75 MG tablet   Commonly known as: PLAVIX   Take 75 mg by mouth daily.      cyclobenzaprine 10 MG tablet   Commonly known as: FLEXERIL   Take 10 mg by mouth at bedtime as needed. Muscle spasms      escitalopram 10 MG tablet   Commonly known as: LEXAPRO   TAKE 1 TABLET BY MOUTH ONCE DAILY      esomeprazole 40 MG capsule   Commonly known as: NEXIUM   Take 1 capsule (40 mg total) by mouth daily before breakfast. Acid reflux      furosemide 40 MG tablet   Commonly known as: LASIX   Take 1 tablet (40 mg total) by mouth 2 (two) times daily.      isosorbide-hydrALAZINE 20-37.5 MG per tablet   Commonly known as:  BIDIL   Take 1 tablet by mouth 3 (three) times daily.      solifenacin 5 MG tablet   Commonly known as: VESICARE   Take 2 tablets (10 mg total) by mouth daily.      zolpidem 10 MG tablet   Commonly known as: AMBIEN   take 1 tablet by mouth at bedtime if needed for sleep           Follow-up Information    Follow up with Carrie Mew, MD. Schedule an appointment as soon as possible for a visit in 2 weeks. (after discharge from SNF)    Contact information:   [address] [facility] [place] [address] [phone]           The results of significant diagnostics from this hospitalization (including imaging, microbiology, ancillary and laboratory) are listed below for reference.    Significant Diagnostic Studies: Dg Chest 2 View  02/29/2012  *RADIOLOGY REPORT*  Clinical Data: Shortness of breath.  CHEST - 2 VIEW  Comparison: 02/15/2012.  Findings: Trachea is midline.  Heart size normal.  Interval improvement in diffuse mixed interstitial and airspace disease, without complete resolution.  Tiny bilateral pleural effusions. Biapical pleural parenchymal scarring.  IMPRESSION: Mild congestive heart failure, improved from 02/15/2012.   Original Report Authenticated By: Leanna Battles, M.D.    Dg Chest 2 View  02/15/2012  *RADIOLOGY REPORT*  Clinical Data: Hypertension. Shortness of breath.  CHEST - 2 VIEW  Comparison: PA and lateral chest 03/27/2003.  Findings: The patient has small bilateral pleural effusions, larger on the left.  There is patchy bilateral airspace disease appearing worst in the bases and greater on the left.  Heart size is upper normal.  IMPRESSION: Small bilateral pleural effusions and patchy airspace disease could be due to asymmetric edema or pneumonia.   Original Report Authenticated By: Holley Dexter, M.D.    Nm Pulmonary Perf And Vent  03/01/2012  *RADIOLOGY REPORT*  Clinical Data: Hypoxia  NM PULMONARY VENTILATION AND PERFUSION SCAN  Views:  Anterior,  posterior, right lateral, left lateral, RPO, LPO, RAO, LAO  Radiopharmaceutical: Technetium 54m DTPA:  Ventilation; technetium 51m macroaggregated albumin:  Perfusion  Dose:  40.0 mCi:  Ventilation; 3.0 mCi:  Perfusion  Route of administration:  Inhalation:  Ventilation; intravenous: Perfusion  Comparison:  Chest radiograph February 29, 2012  Findings: The ventilation study shows mildly diminished ventilation globally  on the left compared to the right. There are no focal segmental ventilation defects.  The perfusion study shows mildly decreased uptake globally on the left compared the right, matching the ventilation study.  There is no focal segmental perfusion defect.  There are no focal ventilation / perfusion mismatches.  IMPRESSION: Overall diminished ventilation and perfusion to the left lung compared to the right.  This finding suggests asymmetric emphysematous type change. There are no appreciable ventilation / perfusion mismatch defects.  This study constitutes a low probability of pulmonary embolus.   Original Report Authenticated By: Bretta Bang, M.D.     Labs: Basic Metabolic Panel:  Lab 03/04/12 7829 03/03/12 0500 03/02/12 0605 03/01/12 0640 02/29/12 1337  NA 132* 133* 138 137 138  K 5.0 5.7* 4.2 3.4* 3.3*  CL 97 98 99 95* 100  CO2 25 26 25 24 23   GLUCOSE 112* 104* 125* 140* 164*  BUN 31* 27* 21 17 19   CREATININE 3.39* 3.43* 2.48* 1.59* 1.55*  CALCIUM 9.8 10.0 10.5 10.5 10.7*  MG -- -- 1.9 -- --  PHOS -- -- -- -- --   Liver Function Tests:  Lab 02/29/12 1337  AST 14  ALT 12  ALKPHOS 138*  BILITOT 0.6  PROT 7.6  ALBUMIN 3.7   CBC:  Lab 03/01/12 0640 02/29/12 1337  WBC 8.6 10.0  NEUTROABS -- 9.2*  HGB 11.4* 10.6*  HCT 33.3* 30.6*  MCV 93.0 92.4  PLT 292 269    BNP (last 3 results)  Basename 02/29/12 1655  PROBNP 7880.0*     Signed:  Arlynn Stare  [facility] 03/04/2012, 12:29 PM

## 2012-03-04 NOTE — Progress Notes (Signed)
Speech Language Pathology Dysphagia Treatment Patient Details Name: Lori Fox MRN: [id number] DOB: November 26, 1940 Today's Date: 03/04/2012 Time: 0865-7846 SLP Time Calculation (min): 8 min  Assessment / Plan / Recommendation Clinical Impression  Follow up for dysphagia treatment and ability to upgrade to regular texture. Pt. alert and sitting in recliner and denies difficulty masticating food since lower dentures are in.  No s/s aspiration or difficulty masticating with thin water via straw and cracker.  Pt. demonstrated modified independence cues to check oral cavity at end of meals.  Recommend upgrade to regular texture.  No f/u ST needed.    Diet Recommendation  Initiate / Change Diet: Regular;Thin liquid    SLP Plan All goals met;Discharge SLP treatment due to (comment)   Pertinent Vitals/Pain No indications   Swallowing Goals  SLP Swallowing Goals Patient will consume recommended diet without observed clinical signs of aspiration with: Supervision/safety;Set-up Swallow Study Goal #1 - Progress: Progressing toward goal  General Temperature Spikes Noted: No Respiratory Status: Supplemental O2 delivered via (comment) Behavior/Cognition: Alert;Cooperative;Pleasant mood Oral Cavity - Dentition: Dentures, top;Dentures, bottom Patient Positioning: Upright in chair  Oral Cavity - Oral Hygiene Does patient have any of the following "at risk" factors?: Oxygen therapy - cannula, mask, simple oxygen devices Brush patient's teeth BID with toothbrush (using toothpaste with fluoride): Yes   Dysphagia Treatment Treatment focused on: Skilled observation of diet tolerance;Patient/family/caregiver education Treatment Methods/Modalities: Skilled observation Patient observed directly with PO's: Yes Type of PO's observed: Dysphagia 3 (soft);Thin liquids Feeding: Able to feed self Liquids provided via: Straw Type of cueing: Verbal Amount of cueing:  (modified independence)       Royce Macadamia M.Ed [employer] [phone]  03/04/2012

## 2012-03-04 NOTE — Progress Notes (Signed)
Pt d/c to [facility].. [phone]. Report call. D/c instructions and medications reviewed with PT and SNF. Pt and SNF stated understanding . All PT questions answered and all SNF questions answered

## 2012-03-25 ENCOUNTER — Other Ambulatory Visit: Payer: Self-pay | Admitting: Internal Medicine

## 2012-03-28 ENCOUNTER — Ambulatory Visit (INDEPENDENT_AMBULATORY_CARE_PROVIDER_SITE_OTHER): Payer: Medicare Other | Admitting: Internal Medicine

## 2012-03-28 ENCOUNTER — Telehealth: Payer: Self-pay | Admitting: Internal Medicine

## 2012-03-28 ENCOUNTER — Encounter: Payer: Self-pay | Admitting: Internal Medicine

## 2012-03-28 VITALS — BP 144/88 | HR 76 | Temp 98.6°F | Resp 16 | Ht 69.0 in | Wt 152.0 lb

## 2012-03-28 DIAGNOSIS — I6350 Cerebral infarction due to unspecified occlusion or stenosis of unspecified cerebral artery: Secondary | ICD-10-CM

## 2012-03-28 DIAGNOSIS — I739 Peripheral vascular disease, unspecified: Secondary | ICD-10-CM

## 2012-03-28 DIAGNOSIS — I639 Cerebral infarction, unspecified: Secondary | ICD-10-CM

## 2012-03-28 DIAGNOSIS — I4891 Unspecified atrial fibrillation: Secondary | ICD-10-CM | POA: Insufficient documentation

## 2012-03-28 DIAGNOSIS — G459 Transient cerebral ischemic attack, unspecified: Secondary | ICD-10-CM

## 2012-03-28 MED ORDER — DILTIAZEM HCL ER COATED BEADS 180 MG PO CP24: 180.0000 mg | ORAL_CAPSULE | Freq: Every day | ORAL | Status: DC

## 2012-03-28 NOTE — Telephone Encounter (Signed)
Not taking vesicare

## 2012-03-28 NOTE — Telephone Encounter (Signed)
[facility] left a vmail on my extension. They state that the Vesicare was written for #30, but that it only covers 15 days as Lori Fox takes it twice/day. They want to know if they can get a rx #60 for 30. Please advise.

## 2012-03-28 NOTE — Patient Instructions (Signed)
Stop the lipitor and start cardiazem for rate control of the heart Stop the plavix and start a new medication to prevent stroke form the atrial  Rate ( irregular heart rate)   New medication is xarelto 20 mg one a day  If you have nose bleeds or gum bleeding that does not stop stop this an call me

## 2012-03-28 NOTE — Progress Notes (Signed)
Subjective:    Patient ID: Lori Fox, female    DOB: [birth year], 72 y.o.   MRN: [id number]  HPI Patient with CVA, pneumonia and c-dif colitis, HTN and AFib Show prolonged hospitalization both at home and at high [facility] and a brief stay in a rehabilitation center.  She had marked deconditioning from her illness.  She has a history of acute on chronic diastolic congestive heart failure this most probably related from rapid atrial fibrillation today she presents with a pulse of 120 and a blood pressure 144/88 and EKG confirmed atrial fibrillation.     Review of Systems  Constitutional: Positive for activity change, appetite change and fatigue.  HENT: Positive for hearing loss.   Respiratory: Positive for shortness of breath.   Cardiovascular: Positive for palpitations.  Endocrine: Negative.   Genitourinary: Negative.   Neurological: Positive for weakness.   Past Medical History  Diagnosis Date  . Hyperlipidemia   . Stroke   . Hypertension   . Arthritis   . PVD (peripheral vascular disease)   . Blindness of left eye   . Allergy   . GERD (gastroesophageal reflux disease)     History   Social History  . Marital Status: Widowed    Spouse Name: N/A    Number of Children: N/A  . Years of Education: N/A   Occupational History  . Not on file.   Social History Main Topics  . Smoking status: Former Games developer  . Smokeless tobacco: Not on file  . Alcohol Use: No  . Drug Use: No  . Sexually Active: No   Other Topics Concern  . Not on file   Social History Narrative   Patient lives with a family friend and POA    Past Surgical History  Procedure Laterality Date  . Carotid endarterectomy    . Foot surgery rt metatarsal    . Rt knee arthroscopic    . Tee without cardioversion  02/18/2012    Procedure: TRANSESOPHAGEAL ECHOCARDIOGRAM (TEE);  Surgeon: Lewayne Bunting, MD;  Location: [facility] ENDOSCOPY;  Service: Cardiovascular;  Laterality: N/A;    Family History  Problem  Relation Age of Onset  . Cancer Father     head and neck  . Alzheimer's disease Mother     Allergies  Allergen Reactions  . Codeine Sulfate     REACTION: welts, hives    Current Outpatient Prescriptions on File Prior to Visit  Medication Sig Dispense Refill  . ALPRAZolam (XANAX) 0.5 MG tablet Take 1 tablet (0.5 mg total) by mouth 2 (two) times daily.  30 tablet  0  . carvedilol (COREG) 25 MG tablet TAKE 1 TABLET TWICE DAILY  60 tablet  3  . cloNIDine (CATAPRES) 0.2 MG tablet Take 0.5 tablets (0.1 mg total) by mouth 3 (three) times daily.      . cyclobenzaprine (FLEXERIL) 10 MG tablet Take 10 mg by mouth at bedtime as needed. Muscle spasms      . escitalopram (LEXAPRO) 10 MG tablet TAKE 1 TABLET BY MOUTH ONCE DAILY  30 tablet  5  . furosemide (LASIX) 40 MG tablet Take 1 tablet (40 mg total) by mouth 2 (two) times daily.      . isosorbide-hydrALAZINE (BIDIL) 20-37.5 MG per tablet Take 1 tablet by mouth 3 (three) times daily.      Marland Kitchen zolpidem (AMBIEN) 10 MG tablet take 1 tablet by mouth at bedtime if needed for sleep  30 tablet  5   No current facility-administered medications on  file prior to visit.    BP 144/88  Pulse 76  Temp(Src) 98.6 F (37 C)  Resp 16  Ht 5\' 9"  (1.753 m)  Wt 152 lb (68.947 kg)  BMI 22.44 kg/m2       Objective:   Physical Exam  Nursing note and vitals reviewed. Constitutional: She appears well-developed and well-nourished.  Cardiovascular:  Murmur heard. Rapid atrial fibrillation  Pulmonary/Chest: Effort normal and breath sounds normal.  Abdominal: Bowel sounds are normal.  Musculoskeletal: She exhibits edema. She exhibits no tenderness.  Neurological:  Hemiparesis on the right  Skin: Skin is warm and dry.          Assessment & Plan:  Will discontinue Lipitor and start on Cardizem for rate control should give some additional blood pressure control as well.  She was started on oral anticoagulant zarelto 20 mg by mouth daily and discontinue  the Plavix.  She will see our advanced clinical practitioner in 4 weeks' time to monitor both her blood pressure her rate control and any potential side effects from the anticoagulant therapy. At that time a basic metabolic panel and a CBC differential should be drawn

## 2012-03-29 ENCOUNTER — Telehealth: Payer: Self-pay | Admitting: *Deleted

## 2012-03-29 NOTE — Telephone Encounter (Signed)
Dodi's caregiver called and asking questions about coreg-  Was she to stop coreg since starting diltiazem-charts states to stop lipitor and start diltiazem for rhythm control. Please advise

## 2012-03-30 ENCOUNTER — Other Ambulatory Visit: Payer: Self-pay | Admitting: *Deleted

## 2012-03-30 MED ORDER — CARVEDILOL 25 MG PO TABS: 12.5000 mg | ORAL_TABLET | Freq: Two times a day (BID) | ORAL | Status: DC

## 2012-03-30 NOTE — Telephone Encounter (Signed)
Change to coreg 12.5 bid per dr Lovell Sheehan

## 2012-04-02 ENCOUNTER — Other Ambulatory Visit: Payer: Self-pay | Admitting: Internal Medicine

## 2012-04-04 ENCOUNTER — Telehealth: Payer: Self-pay | Admitting: Internal Medicine

## 2012-04-04 MED ORDER — OXYBUTYNIN CHLORIDE ER 10 MG PO TB24: 10.0000 mg | ORAL_TABLET | Freq: Every day | ORAL | Status: DC

## 2012-04-04 MED ORDER — OMEPRAZOLE 20 MG PO CPDR: 20.0000 mg | DELAYED_RELEASE_CAPSULE | Freq: Every day | ORAL | Status: DC

## 2012-04-04 NOTE — Telephone Encounter (Signed)
Patient's caregiver called stating that she need a refill of her ditropan xL 10 mg 1poqd,omeprazole 20mg  1poqd sent to [facility] in [place]. Please assist.

## 2012-04-04 NOTE — Telephone Encounter (Signed)
Done

## 2012-04-04 NOTE — Telephone Encounter (Signed)
Lori Fox from [facility] is requesting to be contacted she needs clarification on oxybutynin (DITROPAN XL) 10 MG 24 hr tablet

## 2012-04-07 ENCOUNTER — Telehealth: Payer: Self-pay | Admitting: Internal Medicine

## 2012-04-07 MED ORDER — FUROSEMIDE 40 MG PO TABS: 40.0000 mg | ORAL_TABLET | Freq: Two times a day (BID) | ORAL | Status: DC

## 2012-04-07 NOTE — Telephone Encounter (Signed)
done

## 2012-04-07 NOTE — Telephone Encounter (Signed)
Pt was release from rehab and needs new rx for lasix call into [facility] [phone]

## 2012-04-25 ENCOUNTER — Ambulatory Visit: Payer: Medicare Other | Admitting: Family

## 2012-04-28 ENCOUNTER — Telehealth: Payer: Self-pay | Admitting: Internal Medicine

## 2012-04-28 MED ORDER — CYCLOBENZAPRINE HCL 10 MG PO TABS: 10.0000 mg | ORAL_TABLET | Freq: Every evening | ORAL | Status: DC | PRN

## 2012-04-28 NOTE — Telephone Encounter (Signed)
done

## 2012-04-28 NOTE — Telephone Encounter (Signed)
Pt needs a new script for cyclobenzaprine (FLEXERIL) 10 MG tablet.  Caregiver states this was given to pt when she was in the hospital. w/ no refills. [facility], [place], [place]

## 2012-05-03 ENCOUNTER — Ambulatory Visit: Payer: Self-pay | Admitting: Family

## 2012-05-03 ENCOUNTER — Other Ambulatory Visit: Payer: Self-pay | Admitting: *Deleted

## 2012-05-03 MED ORDER — FUROSEMIDE 20 MG PO TABS: 20.0000 mg | ORAL_TABLET | Freq: Every day | ORAL | Status: DC

## 2012-05-15 ENCOUNTER — Other Ambulatory Visit: Payer: Self-pay | Admitting: Internal Medicine

## 2012-05-17 ENCOUNTER — Encounter: Payer: Self-pay | Admitting: Family

## 2012-05-17 ENCOUNTER — Ambulatory Visit (INDEPENDENT_AMBULATORY_CARE_PROVIDER_SITE_OTHER): Payer: No Typology Code available for payment source | Admitting: Family

## 2012-05-17 VITALS — BP 170/68 | HR 63 | Wt 155.0 lb

## 2012-05-17 DIAGNOSIS — I739 Peripheral vascular disease, unspecified: Secondary | ICD-10-CM

## 2012-05-17 DIAGNOSIS — I10 Essential (primary) hypertension: Secondary | ICD-10-CM

## 2012-05-17 DIAGNOSIS — Z8673 Personal history of transient ischemic attack (TIA), and cerebral infarction without residual deficits: Secondary | ICD-10-CM

## 2012-05-17 DIAGNOSIS — I4891 Unspecified atrial fibrillation: Secondary | ICD-10-CM

## 2012-05-17 LAB — CBC WITH DIFFERENTIAL/PLATELET
Basophils Relative: 0.4 % (ref 0.0–3.0)
HCT: 35.6 % — ABNORMAL LOW (ref 36.0–46.0)
Hemoglobin: 12.1 g/dL (ref 12.0–15.0)
Lymphocytes Relative: 19.9 % (ref 12.0–46.0)
Lymphs Abs: 1.2 10*3/uL (ref 0.7–4.0)
MCHC: 34 g/dL (ref 30.0–36.0)
Monocytes Relative: 7.4 % (ref 3.0–12.0)
Neutro Abs: 4.4 10*3/uL (ref 1.4–7.7)
RBC: 3.72 Mil/uL — ABNORMAL LOW (ref 3.87–5.11)

## 2012-05-17 LAB — BASIC METABOLIC PANEL
CO2: 25 mEq/L (ref 19–32)
Calcium: 10.1 mg/dL (ref 8.4–10.5)
Sodium: 137 mEq/L (ref 135–145)

## 2012-05-17 NOTE — Progress Notes (Signed)
  Subjective:    Patient ID: Lori Fox, female    DOB: 07/15/1940, 72 y.o.   MRN: [id number]  HPI  72 year old white female, patient of Dr. Lovell Sheehan is in for recheck of atrial fibrillation, for peripheral vascular disease, history of TIA. At her last office visit, she was started on Xarelto by Dr. Lovell Sheehan. She reports doing well and tolerating the medication well. Denies any concerns. Reports blood pressure readings at home being between 135-140 systolically over 60s to 70s. And and and and and and and  Review of Systems  Constitutional: Negative.   HENT: Negative.   Respiratory: Negative.   Cardiovascular: Negative.   Gastrointestinal: Negative.   Endocrine: Negative.   Genitourinary: Negative.   Musculoskeletal: Negative.   Skin: Negative.   Neurological: Negative.   Hematological: Negative.    And    Objective:   Physical Exam  Constitutional: She is oriented to person, place, and time. She appears well-developed and well-nourished.  HENT:  Right Ear: External ear normal.  Left Ear: External ear normal.  Nose: Nose normal.  Mouth/Throat: Oropharynx is clear and moist.  Neck: Normal range of motion. Neck supple.  Cardiovascular: Normal rate, regular rhythm and normal heart sounds.   Recheck blood pressure: 130/60  Pulmonary/Chest: Effort normal and breath sounds normal.  Abdominal: Soft. Bowel sounds are normal.  Musculoskeletal: Normal range of motion.  Neurological: She is alert and oriented to person, place, and time.  Skin: Skin is warm and dry.  Psychiatric: She has a normal mood and affect.          Assessment & Plan:  Assessment:  1. Atrial fibrillation 2. Peripheral vascular disease 3. History of TIA 4. Hypertension  Plan: Continue current medications. Continue monitoring blood pressure at home. Call with systolic blood pressure readings are above 140s. Patient to followup with Dr. Lovell Sheehan or myself in 3 months and sooner as needed.

## 2012-06-06 ENCOUNTER — Other Ambulatory Visit: Payer: Self-pay | Admitting: Internal Medicine

## 2012-06-08 ENCOUNTER — Other Ambulatory Visit: Payer: Self-pay | Admitting: Internal Medicine

## 2012-06-08 ENCOUNTER — Telehealth: Payer: Self-pay | Admitting: Internal Medicine

## 2012-06-08 MED ORDER — RIVAROXABAN 20 MG PO TABS: 20.0000 mg | ORAL_TABLET | Freq: Every day | ORAL | Status: DC

## 2012-06-08 NOTE — Telephone Encounter (Signed)
Pt needs new rx xarelto 20 mg call into [facility] [place]. Pt was given samples by NP

## 2012-06-27 ENCOUNTER — Encounter: Payer: Self-pay | Admitting: Family Medicine

## 2012-06-27 ENCOUNTER — Ambulatory Visit (INDEPENDENT_AMBULATORY_CARE_PROVIDER_SITE_OTHER): Payer: No Typology Code available for payment source | Admitting: Family Medicine

## 2012-06-27 VITALS — BP 178/72 | Temp 97.4°F | Wt 155.0 lb

## 2012-06-27 DIAGNOSIS — R229 Localized swelling, mass and lump, unspecified: Secondary | ICD-10-CM

## 2012-06-27 DIAGNOSIS — T148XXA Other injury of unspecified body region, initial encounter: Secondary | ICD-10-CM

## 2012-06-27 NOTE — Progress Notes (Addendum)
Chief Complaint  Patient presents with  . Cyst    on right arm;noticed about 3 to 4 days ago     HPI:  Acute visit for bump on arm: -in R forearm, noticed a few days ago -had stroke 15 years ago and does not use this arm -non-tender, does not remember trauma but can not feel in this arm and bumps into things and bruises easily, feels fine otherwise - no pain, fevers, chills, weight loss ROS: See pertinent positives and negatives per HPI.  Past Medical History  Diagnosis Date  . Hyperlipidemia   . Stroke   . Hypertension   . Arthritis   . PVD (peripheral vascular disease)   . Blindness of left eye   . Allergy   . GERD (gastroesophageal reflux disease)     Family History  Problem Relation Age of Onset  . Cancer Father     head and neck  . Alzheimer's disease Mother     History   Social History  . Marital Status: Widowed    Spouse Name: N/A    Number of Children: N/A  . Years of Education: N/A   Social History Main Topics  . Smoking status: Former Games developer  . Smokeless tobacco: None  . Alcohol Use: No  . Drug Use: No  . Sexually Active: No   Other Topics Concern  . None   Social History Narrative   Patient lives with a family friend and POA    Current outpatient prescriptions:ALPRAZolam (XANAX) 0.25 MG tablet, take 1 tablet by mouth twice a day, Disp: 60 tablet, Rfl: 5;  ALPRAZolam (XANAX) 0.5 MG tablet, Take 1 tablet (0.5 mg total) by mouth 2 (two) times daily., Disp: 30 tablet, Rfl: 0;  carvedilol (COREG) 25 MG tablet, Take 0.5 tablets (12.5 mg total) by mouth 2 (two) times daily with a meal., Disp: 30 tablet, Rfl: 6 cloNIDine (CATAPRES) 0.2 MG tablet, Take 0.5 tablets (0.1 mg total) by mouth 3 (three) times daily., Disp: , Rfl: ;  cyclobenzaprine (FLEXERIL) 10 MG tablet, Take 1 tablet (10 mg total) by mouth at bedtime as needed. Muscle spasms, Disp: 30 tablet, Rfl: 3;  diltiazem (CARDIZEM CD) 180 MG 24 hr capsule, Take 1 capsule (180 mg total) by mouth daily.,  Disp: 30 capsule, Rfl: 11 escitalopram (LEXAPRO) 10 MG tablet, TAKE 1 TABLET BY MOUTH ONCE DAILY, Disp: 30 tablet, Rfl: 5;  furosemide (LASIX) 20 MG tablet, Take 1 tablet (20 mg total) by mouth daily., Disp: 30 tablet, Rfl: 3;  hydrALAZINE (APRESOLINE) 25 MG tablet, take 1 tablet by mouth twice a day, Disp: 60 tablet, Rfl: 3;  isosorbide-hydrALAZINE (BIDIL) 20-37.5 MG per tablet, Take 1 tablet by mouth 3 (three) times daily., Disp: , Rfl:  omeprazole (PRILOSEC) 20 MG capsule, Take 1 capsule (20 mg total) by mouth daily., Disp: 30 capsule, Rfl: 11;  oxybutynin (DITROPAN XL) 10 MG 24 hr tablet, Take 1 tablet (10 mg total) by mouth daily., Disp: 30 tablet, Rfl: 11;  Oxybutynin Chloride (DITROPAN PO), Take 1 tablet by mouth daily., Disp: , Rfl: ;  Rivaroxaban (XARELTO) 20 MG TABS, Take 1 tablet (20 mg total) by mouth daily., Disp: 90 tablet, Rfl: 1 zolpidem (AMBIEN) 10 MG tablet, take 1 tablet by mouth at bedtime if needed for sleep, Disp: 30 tablet, Rfl: 2  EXAM:  Filed Vitals:   06/27/12 1325  BP: 178/72  Temp: 97.4 F (36.3 C)    Body mass index is 22.88 kg/(m^2).  GENERAL: vitals reviewed and  listed above, alert, oriented, appears well hydrated and in no acute distress  HEENT: atraumatic, conjunttiva clear, no obvious abnormalities on inspection of external nose and ears  NECK: no obvious masses on inspection  MS: moves all extremities without noticeable abnormality  SKIN: small mobile subcutaneous nodule R forearm, some surrounding echymossis  PSYCH: pleasant and cooperative, no obvious depression or anxiety  ASSESSMENT AND PLAN:  Discussed the following assessment and plan:  Hematoma  Subcutaneous nodule  -favor small hematoma given small amount surrounding echymosis versus benign cyst or lipoma. No signs of infection. Discussed options. Pt will do ice and follow up in 1 month to recheck or sooner if worsens. -Patient advised to return or notify a doctor immediately if symptoms  worsen or persist or new concerns arise.  There are no Patient Instructions on file for this visit.   Kriste Basque R.

## 2012-06-29 ENCOUNTER — Other Ambulatory Visit: Payer: Self-pay | Admitting: *Deleted

## 2012-06-29 DIAGNOSIS — I4891 Unspecified atrial fibrillation: Secondary | ICD-10-CM

## 2012-06-29 MED ORDER — DILTIAZEM HCL ER COATED BEADS 180 MG PO CP24: 180.0000 mg | ORAL_CAPSULE | Freq: Every day | ORAL | Status: DC

## 2012-06-29 MED ORDER — CARVEDILOL 25 MG PO TABS: 12.5000 mg | ORAL_TABLET | Freq: Two times a day (BID) | ORAL | Status: DC

## 2012-06-29 MED ORDER — FUROSEMIDE 20 MG PO TABS: 20.0000 mg | ORAL_TABLET | Freq: Every day | ORAL | Status: DC

## 2012-06-29 MED ORDER — CYCLOBENZAPRINE HCL 10 MG PO TABS: 10.0000 mg | ORAL_TABLET | Freq: Every evening | ORAL | Status: DC | PRN

## 2012-06-29 MED ORDER — HYDRALAZINE HCL 25 MG PO TABS: ORAL_TABLET | ORAL | Status: DC

## 2012-06-29 MED ORDER — RIVAROXABAN 20 MG PO TABS: 20.0000 mg | ORAL_TABLET | Freq: Every day | ORAL | Status: DC

## 2012-06-29 MED ORDER — CLONIDINE HCL 0.2 MG PO TABS: 0.2000 mg | ORAL_TABLET | Freq: Three times a day (TID) | ORAL | Status: DC

## 2012-06-29 MED ORDER — OMEPRAZOLE 20 MG PO CPDR: 20.0000 mg | DELAYED_RELEASE_CAPSULE | Freq: Every day | ORAL | Status: DC

## 2012-06-29 MED ORDER — ESCITALOPRAM OXALATE 10 MG PO TABS: ORAL_TABLET | ORAL | Status: DC

## 2012-06-29 MED ORDER — OXYBUTYNIN CHLORIDE ER 10 MG PO TB24: 10.0000 mg | ORAL_TABLET | Freq: Every day | ORAL | Status: DC

## 2012-06-29 MED ORDER — OXYBUTYNIN CHLORIDE ER 10 MG PO TB24: 10.0000 mg | ORAL_TABLET | Freq: Every day | ORAL | Status: AC

## 2012-07-04 ENCOUNTER — Other Ambulatory Visit: Payer: Self-pay | Admitting: Internal Medicine

## 2012-08-22 ENCOUNTER — Ambulatory Visit (INDEPENDENT_AMBULATORY_CARE_PROVIDER_SITE_OTHER): Payer: No Typology Code available for payment source | Admitting: Internal Medicine

## 2012-08-22 ENCOUNTER — Encounter: Payer: Self-pay | Admitting: Internal Medicine

## 2012-08-22 VITALS — BP 160/80 | HR 76 | Temp 98.0°F | Resp 18 | Ht 69.0 in | Wt 155.0 lb

## 2012-08-22 DIAGNOSIS — I4891 Unspecified atrial fibrillation: Secondary | ICD-10-CM

## 2012-08-22 DIAGNOSIS — I10 Essential (primary) hypertension: Secondary | ICD-10-CM

## 2012-08-22 DIAGNOSIS — T887XXA Unspecified adverse effect of drug or medicament, initial encounter: Secondary | ICD-10-CM

## 2012-08-22 LAB — CBC WITH DIFFERENTIAL/PLATELET
Basophils Absolute: 0 10*3/uL (ref 0.0–0.1)
Eosinophils Absolute: 0 10*3/uL (ref 0.0–0.7)
Lymphocytes Relative: 25.5 % (ref 12.0–46.0)
MCHC: 33.9 g/dL (ref 30.0–36.0)
Monocytes Relative: 6.8 % (ref 3.0–12.0)
Neutrophils Relative %: 67.4 % (ref 43.0–77.0)
RBC: 3.23 Mil/uL — ABNORMAL LOW (ref 3.87–5.11)
RDW: 13.7 % (ref 11.5–14.6)

## 2012-08-22 LAB — BASIC METABOLIC PANEL
Chloride: 103 mEq/L (ref 96–112)
Potassium: 5.2 mEq/L — ABNORMAL HIGH (ref 3.5–5.1)

## 2012-08-22 NOTE — Progress Notes (Signed)
Subjective:    Patient ID: Lori Fox, female    DOB: [birth year], 72 y.o.   MRN: [id number]  HPI Patient is a 72 year old female who is followed for atrial fibrillation with a history of stroke on oral anticoagulants.  She also has a history of hypertension and a history of hyperlipidemia.  She presents today for routine followupshe has a chief complaint of a bruise on her arm with hemiparesis and she is on oral anticoagulant for atrial fibrillation.    Review of Systems  HENT: Negative.   Cardiovascular: Negative.   Gastrointestinal: Negative.   Musculoskeletal: Positive for gait problem. Negative for myalgias and joint swelling.  Neurological: Positive for facial asymmetry, speech difficulty and weakness. Negative for seizures.  Hematological: Negative for adenopathy. Bruises/bleeds easily.   Past Medical History  Diagnosis Date  . Hyperlipidemia   . Stroke   . Hypertension   . Arthritis   . PVD (peripheral vascular disease)   . Blindness of left eye   . Allergy   . GERD (gastroesophageal reflux disease)     History   Social History  . Marital Status: Widowed    Spouse Name: N/A    Number of Children: N/A  . Years of Education: N/A   Occupational History  . Not on file.   Social History Main Topics  . Smoking status: Former Games developer  . Smokeless tobacco: Not on file  . Alcohol Use: No  . Drug Use: No  . Sexually Active: No   Other Topics Concern  . Not on file   Social History Narrative   Patient lives with a family friend and POA    Past Surgical History  Procedure Laterality Date  . Carotid endarterectomy    . Foot surgery rt metatarsal    . Rt knee arthroscopic    . Tee without cardioversion  02/18/2012    Procedure: TRANSESOPHAGEAL ECHOCARDIOGRAM (TEE);  Surgeon: Lewayne Bunting, MD;  Location: [facility] ENDOSCOPY;  Service: Cardiovascular;  Laterality: N/A;    Family History  Problem Relation Age of Onset  . Cancer Father     head and neck  .  Alzheimer's disease Mother     Allergies  Allergen Reactions  . Codeine Sulfate     REACTION: welts, hives    Current Outpatient Prescriptions on File Prior to Visit  Medication Sig Dispense Refill  . ALPRAZolam (XANAX) 0.5 MG tablet Take 1 tablet (0.5 mg total) by mouth 2 (two) times daily.  30 tablet  0  . carvedilol (COREG) 25 MG tablet Take 0.5 tablets (12.5 mg total) by mouth 2 (two) times daily with a meal.  90 tablet  3  . cloNIDine (CATAPRES) 0.2 MG tablet TAKE 1 TABLET THREE TIMES A DAY  90 tablet  11  . cyclobenzaprine (FLEXERIL) 10 MG tablet Take 1 tablet (10 mg total) by mouth at bedtime as needed. Muscle spasms  90 tablet  3  . diltiazem (CARDIZEM CD) 180 MG 24 hr capsule Take 1 capsule (180 mg total) by mouth daily.  90 capsule  3  . escitalopram (LEXAPRO) 10 MG tablet TAKE 1 TABLET BY MOUTH ONCE DAILY  90 tablet  3  . furosemide (LASIX) 20 MG tablet Take 1 tablet (20 mg total) by mouth daily.  90 tablet  3  . hydrALAZINE (APRESOLINE) 25 MG tablet take 1 tablet by mouth twice a day  180 tablet  3  . isosorbide-hydrALAZINE (BIDIL) 20-37.5 MG per tablet Take 1 tablet by mouth 3 (  three) times daily.      Marland Kitchen omeprazole (PRILOSEC) 20 MG capsule Take 1 capsule (20 mg total) by mouth daily.  90 capsule  3  . oxybutynin (DITROPAN XL) 10 MG 24 hr tablet Take 1 tablet (10 mg total) by mouth daily.  90 tablet  3  . Rivaroxaban (XARELTO) 20 MG TABS Take 1 tablet (20 mg total) by mouth daily.  90 tablet  3  . zolpidem (AMBIEN) 10 MG tablet take 1 tablet by mouth at bedtime if needed for sleep  30 tablet  2   No current facility-administered medications on file prior to visit.    BP 160/80  Pulse 76  Temp(Src) 98 F (36.7 C)  Resp 18  Ht 5\' 9"  (1.753 m)  Wt 155 lb (70.308 kg)  BMI 22.88 kg/m2       Objective:   Physical Exam  Constitutional: She is oriented to person, place, and time. She appears well-developed and well-nourished.  Neck: Neck supple.  Cardiovascular:   Murmur heard. Irregular rate and rhythm  Pulmonary/Chest: Effort normal and breath sounds normal.  Abdominal: Soft. Bowel sounds are normal.  Neurological: She is alert and oriented to person, place, and time.  Skin:  bruising the skin on the forearms          Assessment & Plan:  Pressure stable on current medications.  No evidence of heart failure.  Atrial fibrillation on oral anticoagulant monitor CBC and liver function.

## 2012-11-10 ENCOUNTER — Other Ambulatory Visit: Payer: Self-pay | Admitting: Internal Medicine

## 2012-11-11 ENCOUNTER — Telehealth: Payer: Self-pay | Admitting: Internal Medicine

## 2012-11-11 MED ORDER — ZOLPIDEM TARTRATE 10 MG PO TABS: ORAL_TABLET | ORAL | Status: DC

## 2012-11-11 MED ORDER — ALPRAZOLAM 0.5 MG PO TABS: 0.5000 mg | ORAL_TABLET | Freq: Two times a day (BID) | ORAL | Status: DC

## 2012-11-11 NOTE — Telephone Encounter (Signed)
Requesting 90 day refill of ALPRAZolam (XANAX) 0.5 MG tablet(last filled 09/20/12)  and zolpidem (AMBIEN) 10 MG tablet(last filled 09/15/12) sent to [facility].

## 2012-11-11 NOTE — Telephone Encounter (Signed)
done

## 2012-12-15 ENCOUNTER — Telehealth: Payer: Self-pay | Admitting: Internal Medicine

## 2012-12-15 NOTE — Telephone Encounter (Signed)
Pt needs new rx generic ambien 10 mg#90 with refills with  sent to [facility] mail order pharm phone # [phone]

## 2012-12-16 ENCOUNTER — Other Ambulatory Visit: Payer: Self-pay | Admitting: *Deleted

## 2012-12-16 MED ORDER — ZOLPIDEM TARTRATE 10 MG PO TABS: ORAL_TABLET | ORAL | Status: DC

## 2012-12-16 NOTE — Telephone Encounter (Signed)
Sent!

## 2012-12-23 ENCOUNTER — Encounter: Payer: Self-pay | Admitting: *Deleted

## 2012-12-26 ENCOUNTER — Ambulatory Visit: Payer: No Typology Code available for payment source | Admitting: Internal Medicine

## 2013-04-20 ENCOUNTER — Other Ambulatory Visit: Payer: Self-pay | Admitting: Internal Medicine

## 2013-04-22 IMAGING — CT CT HEAD W/O CM
1 series · 16 of 30 positions shown, 20 images · non-contrast
Comparison: CT 09/22/2008

CLINICAL DATA: Spasms

CT HEAD WITHOUT CONTRAST
TECHNIQUE: Contiguous axial images were obtained from the base of
the skull through the vertex without contrast.

[Series 2: head 4.8 h37s · axial · 0.41mm/px · z∈[+1139,+1272]mm · 16 of 32 slices shown, 20 images]
[im 2/32  brain]
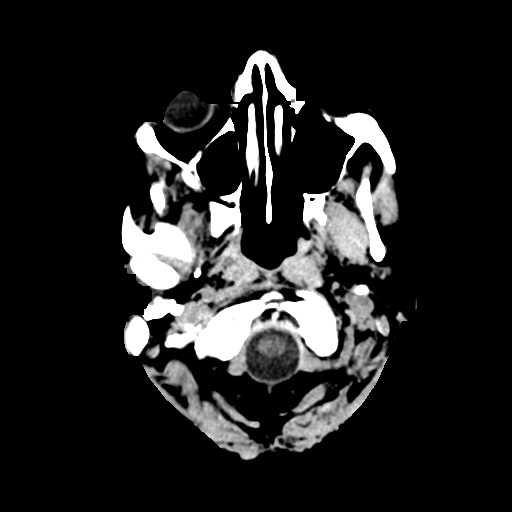
[im 2/32  bone]
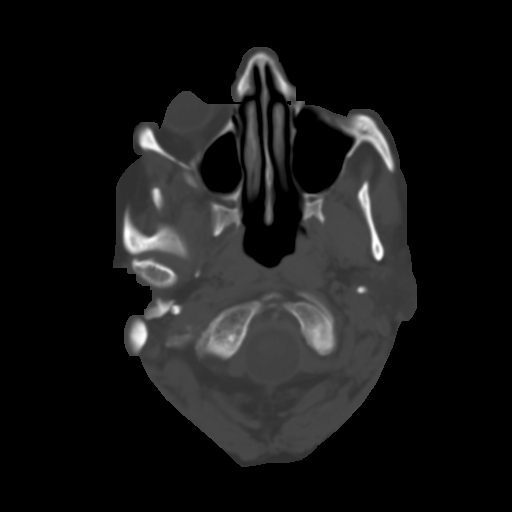
[im 4/32  brain]
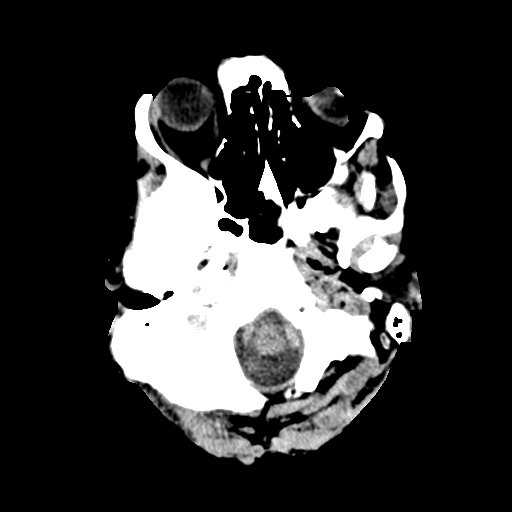
[im 6/32  brain]
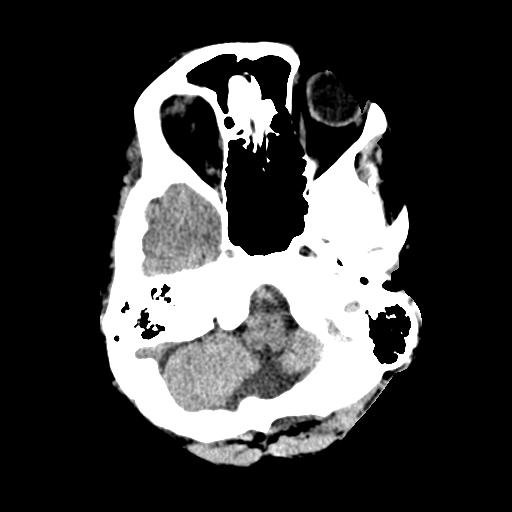
[im 8/32  brain]
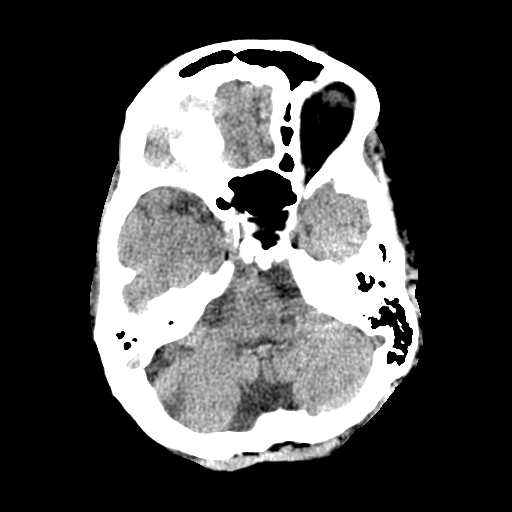
[im 9/32  brain]
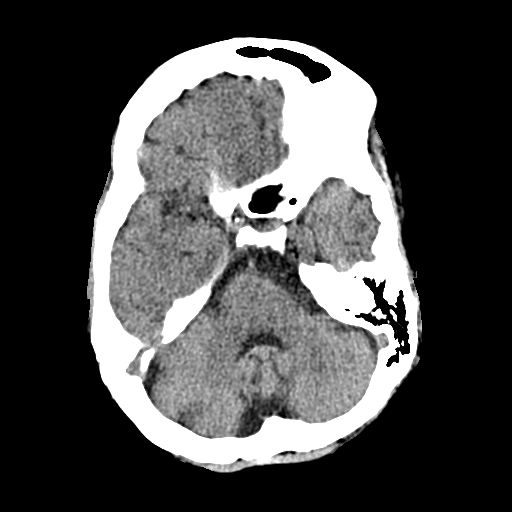
[im 9/32  bone]
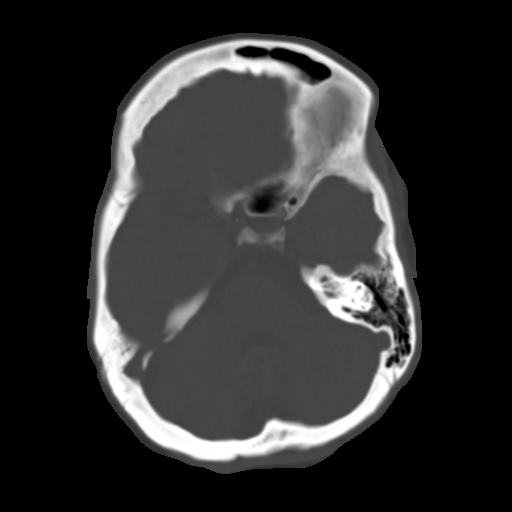
[im 11/32  brain]
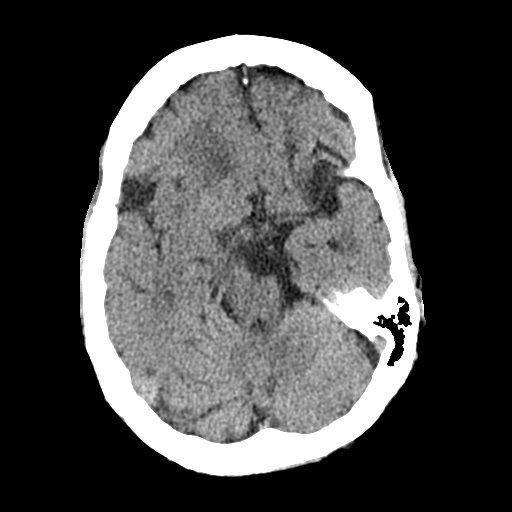
[im 13/32  brain]
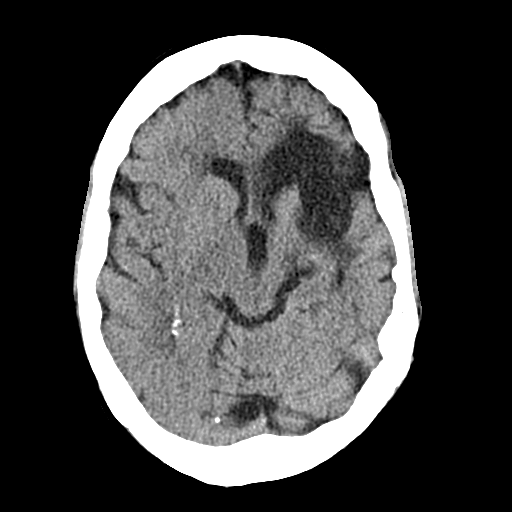
[im 15/32  brain]
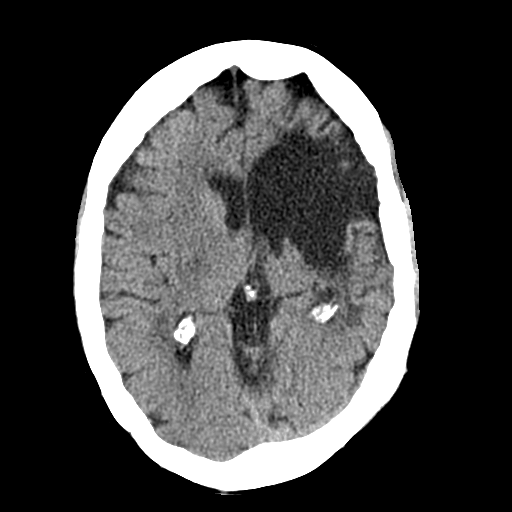
[im 17/32  brain]
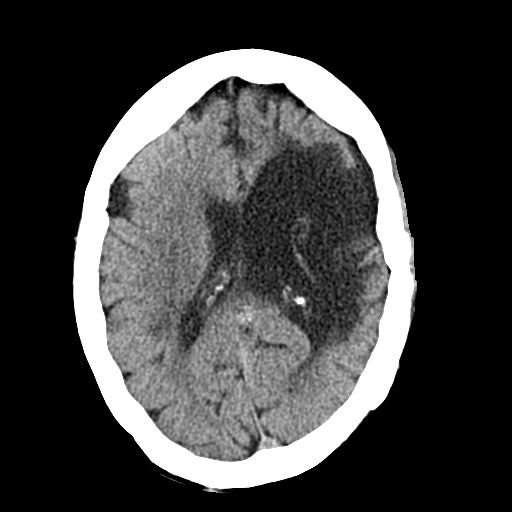
[im 17/32  bone]
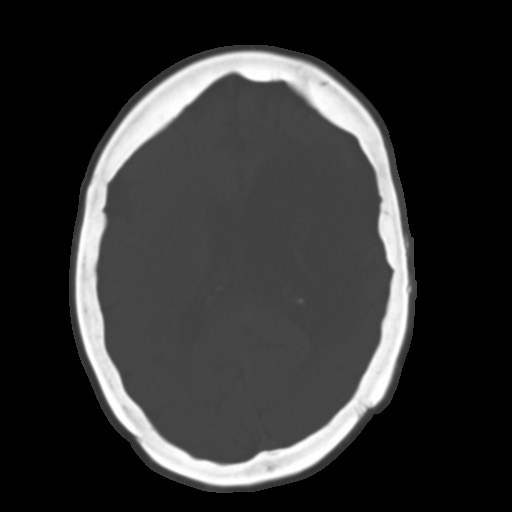
[im 19/32  brain]
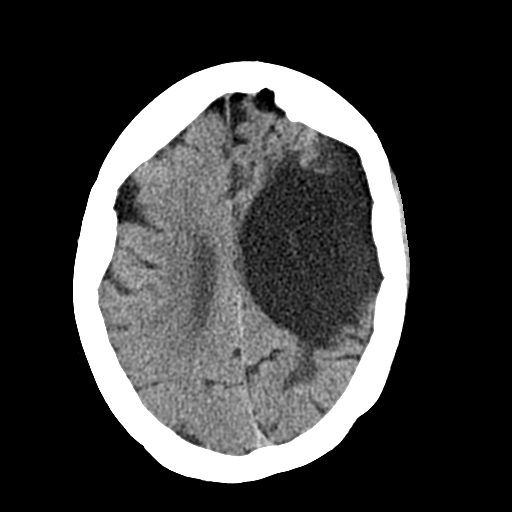
[im 21/32  brain]
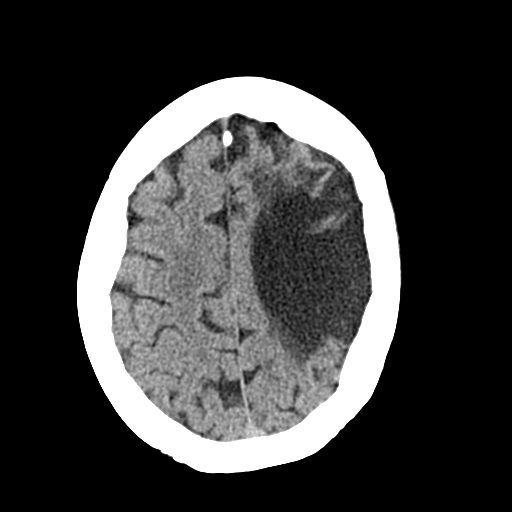
[im 23/32  brain]
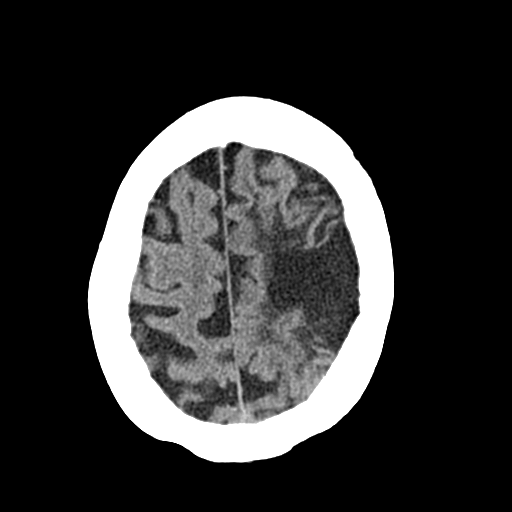
[im 24/32  brain]
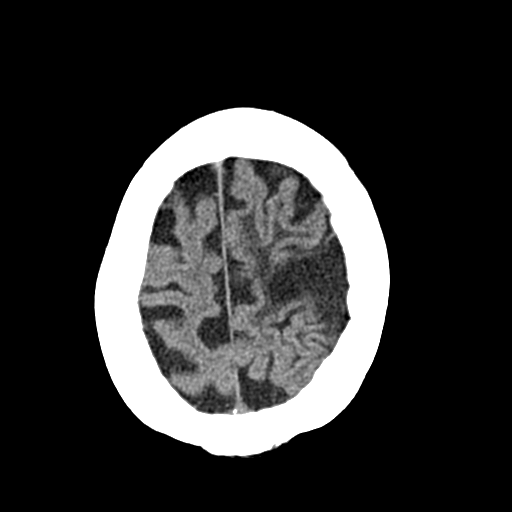
[im 24/32  bone]
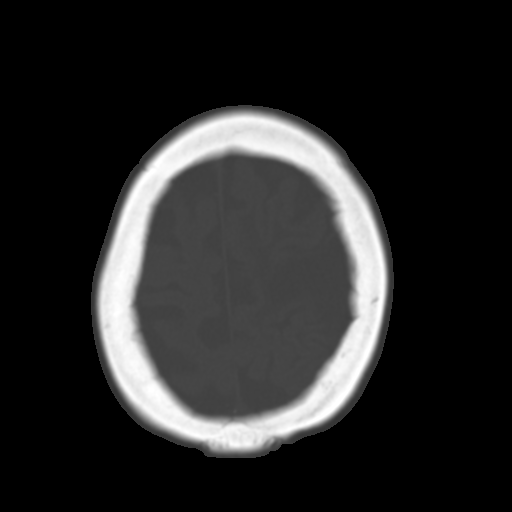
[im 26/32  brain]
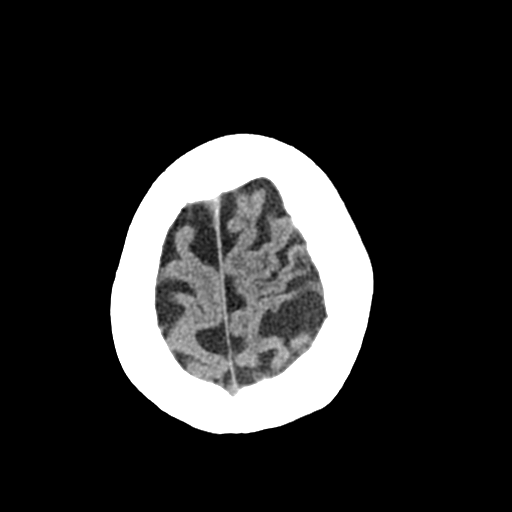
[im 28/32  brain]
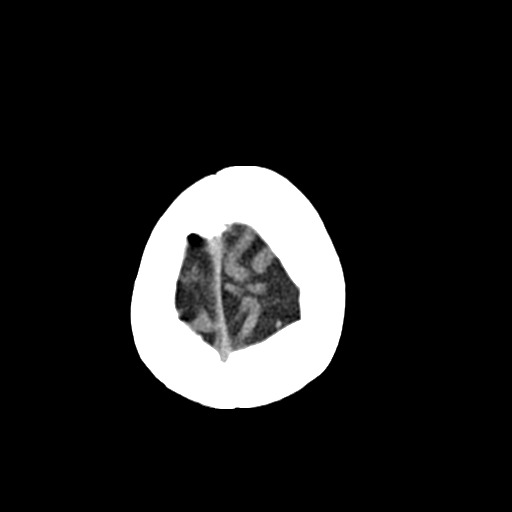
[im 30/32  brain]
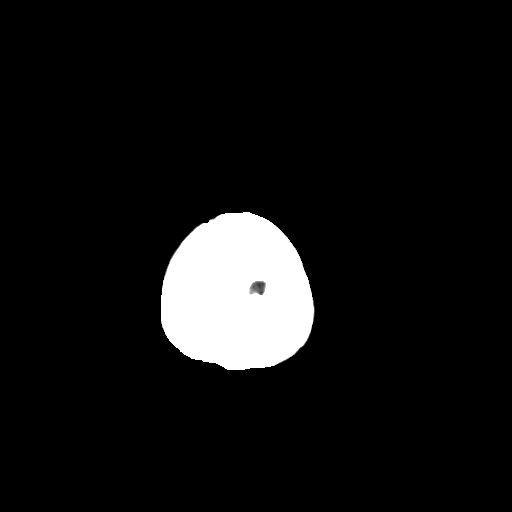

[16 of 30 positions shown; findings below may reference images not displayed]

FINDINGS: Chronic left MCA infarct is unchanged.  There is volume
loss in the left with dilatation of the left lateral ventricle.  No
acute infarct.  Negative for hemorrhage or mass lesion.  Mild
chronic microvascular ischemia in the cerebral white matter on the
right.  Negative for hemorrhage or mass.  Calvarium is intact.
IMPRESSION: Chronic left MCA infarct.  No acute abnormality.

## 2013-05-01 ENCOUNTER — Encounter: Payer: Self-pay | Admitting: Internal Medicine

## 2013-05-01 ENCOUNTER — Ambulatory Visit (INDEPENDENT_AMBULATORY_CARE_PROVIDER_SITE_OTHER): Payer: Medicare Other | Admitting: Internal Medicine

## 2013-05-01 VITALS — BP 140/77 | HR 75 | Temp 97.9°F | Ht 69.0 in | Wt 168.0 lb

## 2013-05-01 DIAGNOSIS — Z23 Encounter for immunization: Secondary | ICD-10-CM

## 2013-05-01 DIAGNOSIS — M1710 Unilateral primary osteoarthritis, unspecified knee: Secondary | ICD-10-CM

## 2013-05-01 DIAGNOSIS — I10 Essential (primary) hypertension: Secondary | ICD-10-CM

## 2013-05-01 NOTE — Progress Notes (Signed)
Pre visit review using our clinic review tool, if applicable. No additional management support is needed unless otherwise documented below in the visit note. 

## 2013-05-01 NOTE — Progress Notes (Signed)
Subjective:    Patient ID: Lori Fox, female    DOB: 12/06/1940, 73 y.o.   MRN: [id number]  HPI  Knee pain and requesting a shot her knee Hx of DJD and severe degenation Did not get the flu shot! Last injection was greater that 6 months Increase sinus pressure  Review of Systems  Constitutional: Positive for activity change, appetite change and fatigue.  HENT: Positive for rhinorrhea, sinus pressure and sneezing.   Respiratory: Positive for shortness of breath.   Cardiovascular: Positive for leg swelling.  Gastrointestinal: Negative.   Genitourinary: Positive for frequency.  Musculoskeletal: Positive for gait problem, joint swelling, myalgias and neck stiffness.  Skin: Negative.    Past Medical History  Diagnosis Date  . Hyperlipidemia   . Stroke   . Hypertension   . Arthritis   . PVD (peripheral vascular disease)   . Blindness of left eye   . Allergy   . GERD (gastroesophageal reflux disease)     History   Social History  . Marital Status: Widowed    Spouse Name: N/A    Number of Children: N/A  . Years of Education: N/A   Occupational History  . Not on file.   Social History Main Topics  . Smoking status: Former Research scientist (life sciences)  . Smokeless tobacco: Not on file  . Alcohol Use: No  . Drug Use: No  . Sexual Activity: No   Other Topics Concern  . Not on file   Social History Narrative   Patient lives with a family friend and POA    Past Surgical History  Procedure Laterality Date  . Carotid endarterectomy    . Foot surgery rt metatarsal    . Rt knee arthroscopic    . Tee without cardioversion  02/18/2012    Procedure: TRANSESOPHAGEAL ECHOCARDIOGRAM (TEE);  Surgeon: Lelon Perla, MD;  Location: [facility] ENDOSCOPY;  Service: Cardiovascular;  Laterality: N/A;    Family History  Problem Relation Age of Onset  . Cancer Father     head and neck  . Alzheimer's disease Mother     Allergies  Allergen Reactions  . Codeine Sulfate     REACTION: welts, hives     Current Outpatient Prescriptions on File Prior to Visit  Medication Sig Dispense Refill  . ALPRAZolam (XANAX) 0.5 MG tablet Take 1 tablet (0.5 mg total) by mouth 2 (two) times daily.  180 tablet  1  . carvedilol (COREG) 25 MG tablet Take 0.5 tablets (12.5 mg total) by mouth 2 (two) times daily with a meal.  90 tablet  3  . cloNIDine (CATAPRES) 0.2 MG tablet TAKE 1 TABLET THREE TIMES A DAY  90 tablet  11  . cloNIDine (CATAPRES) 0.2 MG tablet TAKE 1 TABLET THREE TIMES A DAY  270 tablet  3  . cyclobenzaprine (FLEXERIL) 10 MG tablet Take 1 tablet (10 mg total) by mouth at bedtime as needed. Muscle spasms  90 tablet  3  . diltiazem (CARDIZEM CD) 180 MG 24 hr capsule TAKE 1 CAPSULE DAILY  90 capsule  2  . escitalopram (LEXAPRO) 10 MG tablet TAKE 1 TABLET BY MOUTH ONCE DAILY  90 tablet  3  . furosemide (LASIX) 20 MG tablet Take 1 tablet (20 mg total) by mouth daily.  90 tablet  3  . hydrALAZINE (APRESOLINE) 25 MG tablet take 1 tablet by mouth twice a day  180 tablet  3  . isosorbide-hydrALAZINE (BIDIL) 20-37.5 MG per tablet Take 1 tablet by mouth 3 (three)  times daily.      Marland Kitchen omeprazole (PRILOSEC) 20 MG capsule Take 1 capsule (20 mg total) by mouth daily.  90 capsule  3  . oxybutynin (DITROPAN XL) 10 MG 24 hr tablet Take 1 tablet (10 mg total) by mouth daily.  90 tablet  3  . Rivaroxaban (XARELTO) 20 MG TABS Take 1 tablet (20 mg total) by mouth daily.  90 tablet  3  . zolpidem (AMBIEN) 10 MG tablet take 1 tablet by mouth at bedtime if needed for sleep  90 tablet  1   No current facility-administered medications on file prior to visit.    BP 172/80  Pulse 75  Temp(Src) 97.9 F (36.6 C) (Oral)  Ht 5\' 9"  (1.753 m)  Wt 168 lb (76.204 kg)  BMI 24.80 kg/m2  SpO2 97%        Objective:   Physical Exam  Nursing note and vitals reviewed. Constitutional: She is oriented to person, place, and time. She appears well-nourished.  HENT:  Head: Atraumatic.  Eyes: Conjunctivae are normal. Pupils  are equal, round, and reactive to light.  Pulmonary/Chest: Effort normal and breath sounds normal.  Abdominal: Soft. Bowel sounds are normal.  Neurological: She is oriented to person, place, and time. She displays abnormal reflex. Coordination abnormal.  Skin: Skin is dry.          Assessment & Plan:   Informed consent obtained and the patient's left knee was prepped with betadine. Local anesthesia was obtained with topical spray. Then 40 mg of Depo-Medrol and 1/2 cc of lidocaine was injected into the joint space. The patient tolerated the procedure without complications. Post injection care discussed with patient. ,moderate severe DJD of knee Stable HTN repeat 140/88 Elevation due to pain Due prevnar shot today

## 2013-05-01 NOTE — Addendum Note (Signed)
Addended by: Sharon Seller B on: 05/01/2013 04:05 PM   Modules accepted: Orders

## 2013-05-01 NOTE — Patient Instructions (Signed)
The patient is instructed to continue all medications as prescribed. Schedule followup with check out clerk upon leaving the clinic  

## 2013-05-02 ENCOUNTER — Telehealth: Payer: Self-pay | Admitting: Internal Medicine

## 2013-05-02 NOTE — Telephone Encounter (Signed)
Relevant patient education mailed to patient.  

## 2013-05-06 IMAGING — CR DG CHEST 2V
2 series · 2 of 2 positions shown · non-contrast
Comparison: PA and lateral chest 03/27/2003.

CLINICAL DATA: Hypertension. Shortness of breath.

CHEST - 2 VIEW

[w chest pa]
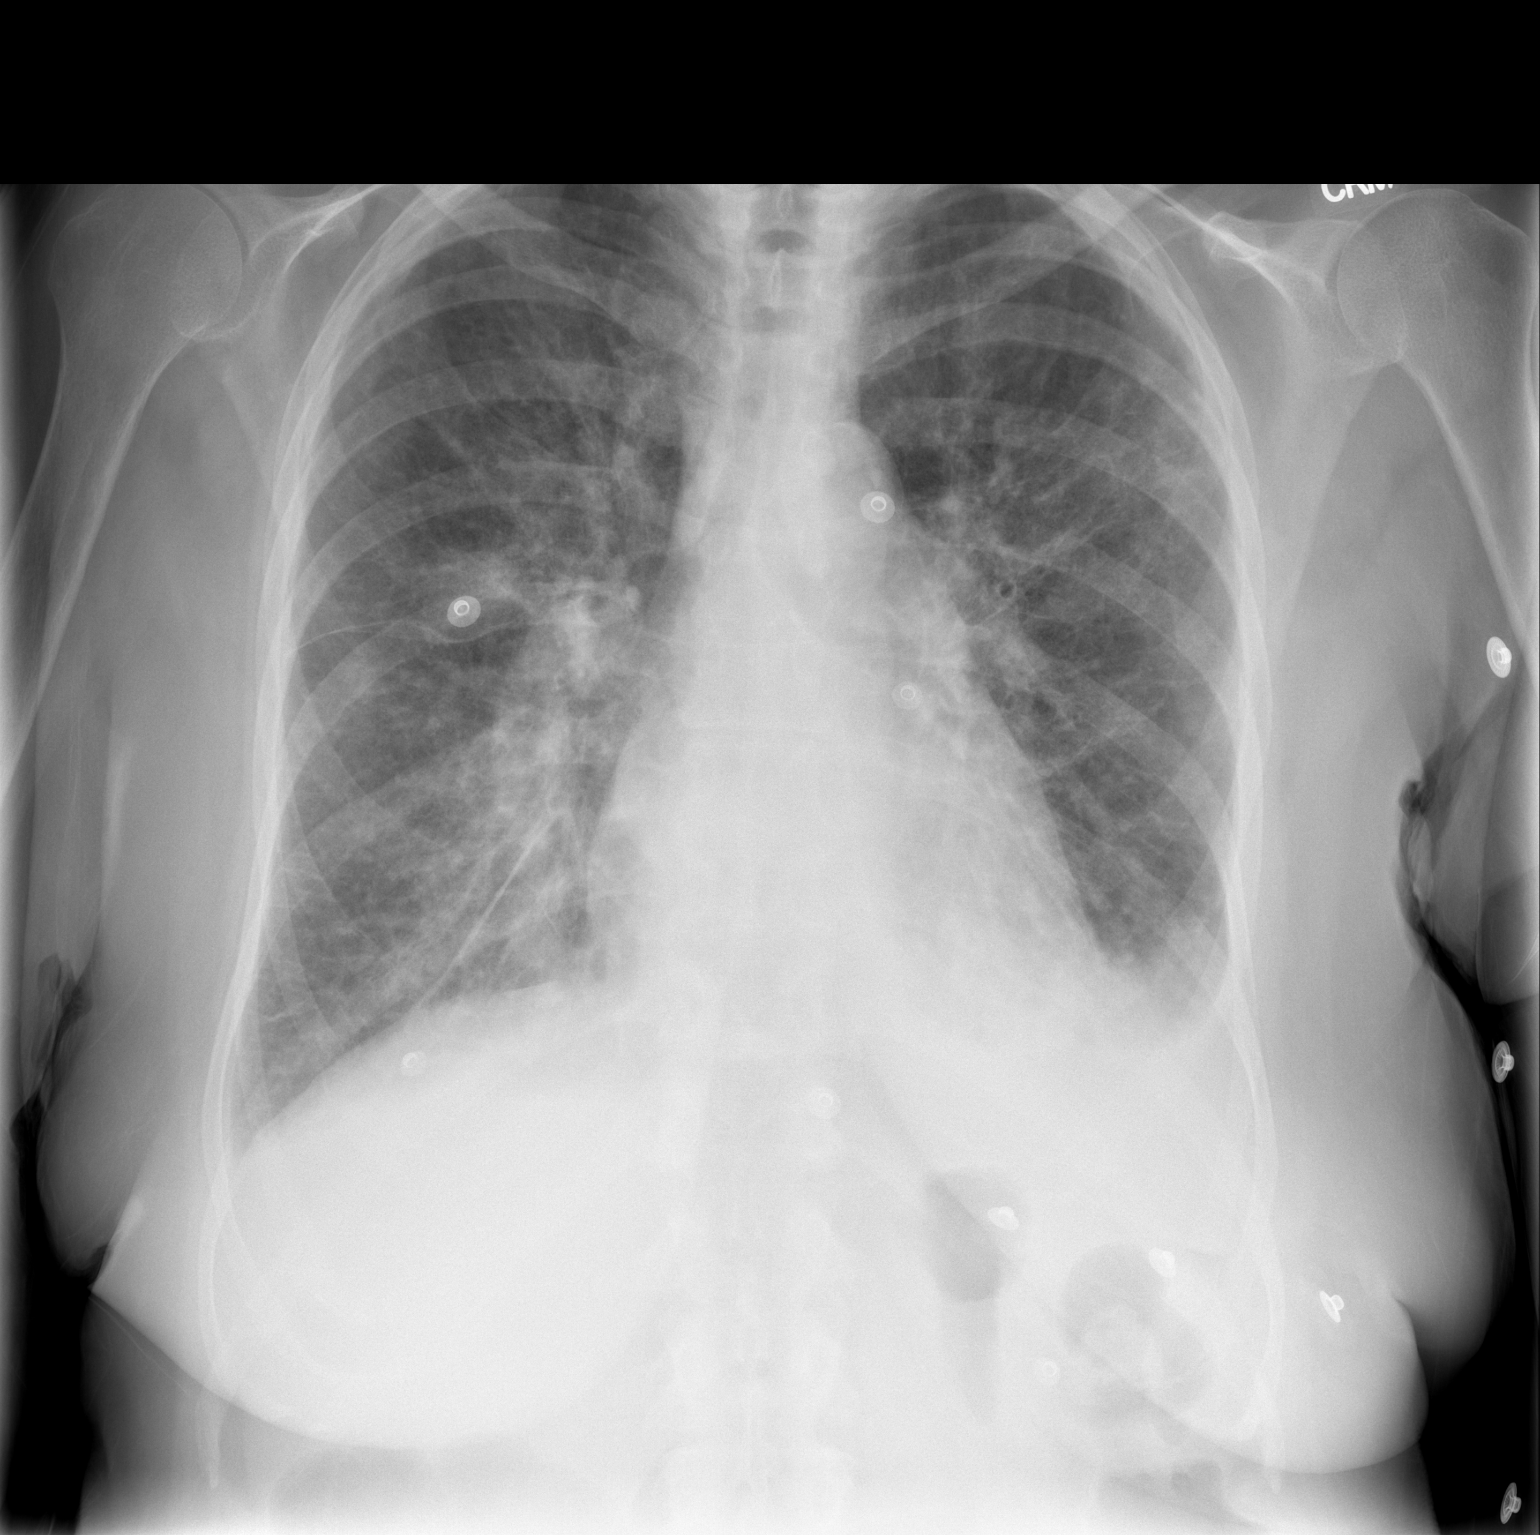

[w chest lat]
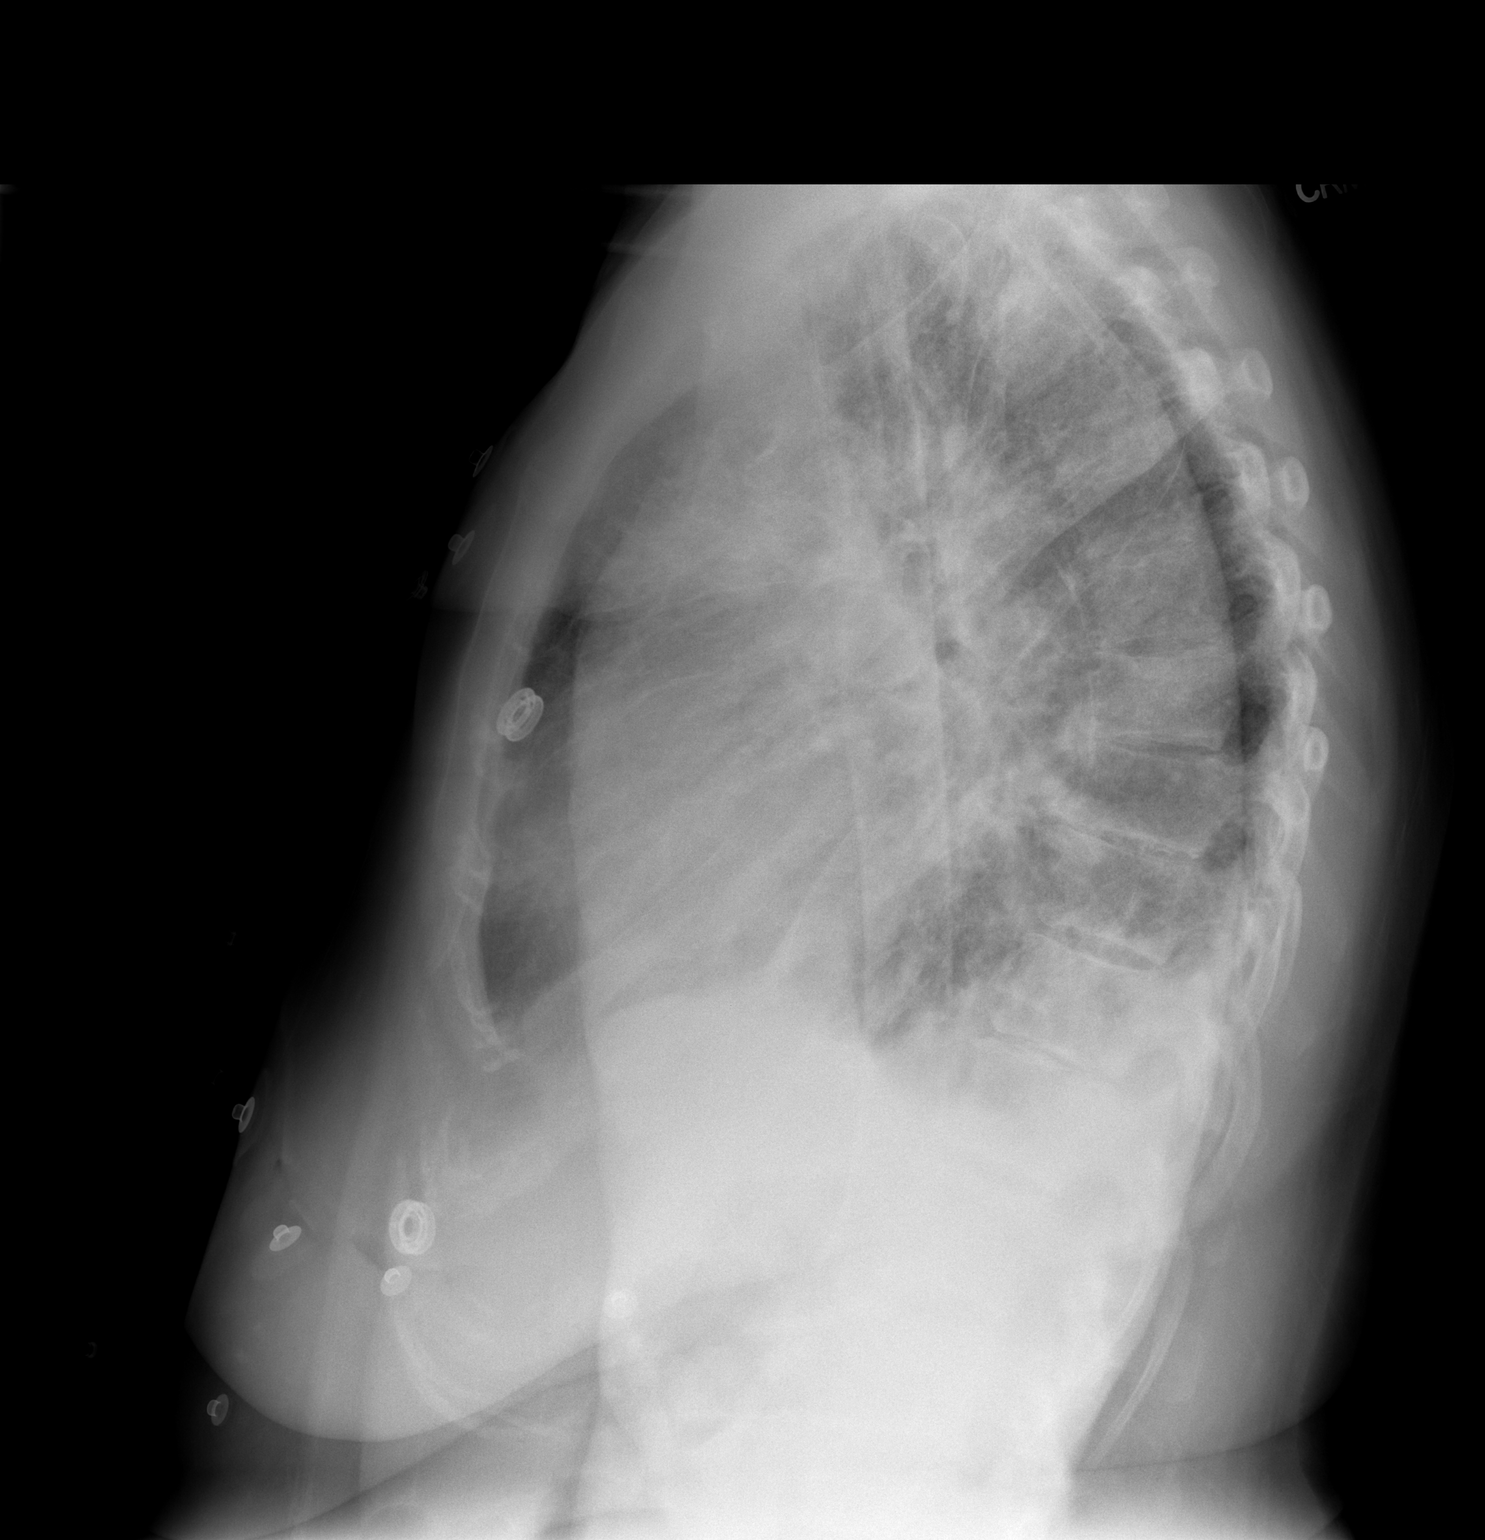

[2 of 2 positions shown; findings below may reference images not displayed]

FINDINGS: The patient has small bilateral pleural effusions, larger
on the left.  There is patchy bilateral airspace disease appearing
worst in the bases and greater on the left.  Heart size is upper
normal.
IMPRESSION: Small bilateral pleural effusions and patchy airspace disease could
be due to asymmetric edema or pneumonia.

## 2013-05-09 ENCOUNTER — Telehealth: Payer: Self-pay | Admitting: Internal Medicine

## 2013-05-09 ENCOUNTER — Other Ambulatory Visit: Payer: Self-pay | Admitting: Internal Medicine

## 2013-05-09 NOTE — Telephone Encounter (Signed)
[place] requesting refill of the following:  carvedilol (COREG) 25 MG tablet escitalopram (LEXAPRO) 10 MG tablet XARELTO 20 MG TABS tablet omeprazole (PRILOSEC) 20 MG capsule hydrALAZINE (APRESOLINE) 25 MG tablet furosemide (LASIX) 20 MG tablet cyclobenzaprine (FLEXERIL) 10 MG tablet

## 2013-05-10 MED ORDER — RIVAROXABAN 20 MG PO TABS: ORAL_TABLET | ORAL | Status: DC

## 2013-05-10 MED ORDER — ESCITALOPRAM OXALATE 10 MG PO TABS: ORAL_TABLET | ORAL | Status: DC

## 2013-05-10 MED ORDER — CARVEDILOL 25 MG PO TABS: 12.5000 mg | ORAL_TABLET | Freq: Two times a day (BID) | ORAL | Status: AC

## 2013-05-10 MED ORDER — FUROSEMIDE 20 MG PO TABS: ORAL_TABLET | ORAL | Status: DC

## 2013-05-10 MED ORDER — CYCLOBENZAPRINE HCL 10 MG PO TABS: ORAL_TABLET | ORAL | Status: AC

## 2013-05-10 MED ORDER — OMEPRAZOLE 20 MG PO CPDR: DELAYED_RELEASE_CAPSULE | ORAL | Status: DC

## 2013-05-10 MED ORDER — CYCLOBENZAPRINE HCL 10 MG PO TABS: ORAL_TABLET | ORAL | Status: DC

## 2013-05-10 MED ORDER — HYDRALAZINE HCL 25 MG PO TABS: ORAL_TABLET | ORAL | Status: DC

## 2013-05-10 NOTE — Addendum Note (Signed)
Addended by: Westley Hummer B on: 05/10/2013 12:08 PM   Modules accepted: Orders

## 2013-05-10 NOTE — Telephone Encounter (Signed)
rx sent in electronically 

## 2013-05-23 ENCOUNTER — Ambulatory Visit (INDEPENDENT_AMBULATORY_CARE_PROVIDER_SITE_OTHER): Payer: Medicare Other | Admitting: Family Medicine

## 2013-05-23 ENCOUNTER — Encounter: Payer: Self-pay | Admitting: Family Medicine

## 2013-05-23 VITALS — BP 142/70 | HR 68 | Temp 98.3°F | Wt 160.0 lb

## 2013-05-23 DIAGNOSIS — I4891 Unspecified atrial fibrillation: Secondary | ICD-10-CM

## 2013-05-23 DIAGNOSIS — G459 Transient cerebral ischemic attack, unspecified: Secondary | ICD-10-CM

## 2013-05-23 DIAGNOSIS — I10 Essential (primary) hypertension: Secondary | ICD-10-CM

## 2013-05-23 DIAGNOSIS — I509 Heart failure, unspecified: Secondary | ICD-10-CM

## 2013-05-23 DIAGNOSIS — I6990 Unspecified sequelae of unspecified cerebrovascular disease: Secondary | ICD-10-CM

## 2013-05-23 DIAGNOSIS — E785 Hyperlipidemia, unspecified: Secondary | ICD-10-CM

## 2013-05-23 DIAGNOSIS — I5033 Acute on chronic diastolic (congestive) heart failure: Secondary | ICD-10-CM

## 2013-05-23 NOTE — Patient Instructions (Signed)
-  please let Dr. Arnoldo Morale know when you establish care with a new doctor  -We placed a referral for you as discussed. It usually takes about 1-2 weeks to process and schedule this referral. If you have not heard from [place] regarding this appointment in 2 weeks please contact our office.

## 2013-05-23 NOTE — Progress Notes (Signed)
Pre visit review using our clinic review tool, if applicable. No additional management support is needed unless otherwise documented below in the visit note. 

## 2013-05-23 NOTE — Progress Notes (Addendum)
Chief Complaint  Patient presents with  . Hospitalization Follow-up    HPI:  HTN: -pt of dr. Arnoldo Morale with hx uncontrolled HTN, A. Fib, TIA, CVA, HLD seen by PCP for HTN on 3/23, recheck BP at that visit ok per [facility] -in hospital (records not in our system) for HTN, COPD, CAP, CHF on 3/27 for almost a week -reports: caregiver reports told needed follow up with PC in next 7-10 days, PCP did not have appt available - given abx in hospital and then sent home on levaquin; caregiver report BP a little up right before next dose of BP medication, but BP good otherwise at home (average of 150-170/70s) at home -denies: fevers, SOB, CP, weakness, falls, depression, bleeding, palpitations, swelling -BP meds: carvedilol 12.5mg  bid, isosorbide-hydralazine 20-37.5 tid, hydralazine 25 bid, lasix 20mg  daily, cardizem CD 18- mg daily clonidine 0.2mg  tid -on ROC seems she has had difficult to control HTN for some time and several hospital/ED visit for this  ROS: See pertinent positives and negatives per HPI.  Past Medical History  Diagnosis Date  . Hyperlipidemia   . Stroke   . Hypertension   . Arthritis   . PVD (peripheral vascular disease)   . Blindness of left eye   . Allergy   . GERD (gastroesophageal reflux disease)     Past Surgical History  Procedure Laterality Date  . Carotid endarterectomy    . Foot surgery rt metatarsal    . Rt knee arthroscopic    . Tee without cardioversion  02/18/2012    Procedure: TRANSESOPHAGEAL ECHOCARDIOGRAM (TEE);  Surgeon: Lelon Perla, MD;  Location: [facility] ENDOSCOPY;  Service: Cardiovascular;  Laterality: N/A;    Family History  Problem Relation Age of Onset  . Cancer Father     head and neck  . Alzheimer's disease Mother     History   Social History  . Marital Status: Widowed    Spouse Name: N/A    Number of Children: N/A  . Years of Education: N/A   Social History Main Topics  . Smoking status: Former Research scientist (life sciences)  . Smokeless tobacco: None  .  Alcohol Use: No  . Drug Use: No  . Sexual Activity: No   Other Topics Concern  . None   Social History Narrative   Patient lives with a family friend and POA    Current outpatient prescriptions:ALPRAZolam (XANAX) 0.5 MG tablet, Take 1 tablet (0.5 mg total) by mouth 2 (two) times daily., Disp: 180 tablet, Rfl: 1;  carvedilol (COREG) 25 MG tablet, Take 0.5 tablets (12.5 mg total) by mouth 2 (two) times daily with a meal., Disp: 90 tablet, Rfl: 3;  cloNIDine (CATAPRES) 0.2 MG tablet, TAKE 1 TABLET THREE TIMES A DAY, Disp: 90 tablet, Rfl: 11 cloNIDine (CATAPRES) 0.2 MG tablet, TAKE 1 TABLET THREE TIMES A DAY, Disp: 270 tablet, Rfl: 3;  cyclobenzaprine (FLEXERIL) 10 MG tablet, TAKE 1 TABLET AT BEDTIME AS NEEDED FOR MUSCLE SPASMS, Disp: 90 tablet, Rfl: 2;  diltiazem (CARDIZEM CD) 180 MG 24 hr capsule, TAKE 1 CAPSULE DAILY, Disp: 90 capsule, Rfl: 2;  escitalopram (LEXAPRO) 10 MG tablet, TAKE 1 TABLET DAILY, Disp: 90 tablet, Rfl: 3 furosemide (LASIX) 20 MG tablet, TAKE 1 TABLET DAILY, Disp: 90 tablet, Rfl: 3;  hydrALAZINE (APRESOLINE) 25 MG tablet, TAKE 1 TABLET TWICE A DAY, Disp: 180 tablet, Rfl: 3;  isosorbide-hydrALAZINE (BIDIL) 20-37.5 MG per tablet, Take 1 tablet by mouth 3 (three) times daily., Disp: , Rfl: ;  omeprazole (PRILOSEC) 20 MG capsule,  TAKE 1 CAPSULE DAILY, Disp: 90 capsule, Rfl: 3 oxybutynin (DITROPAN XL) 10 MG 24 hr tablet, Take 1 tablet (10 mg total) by mouth daily., Disp: 90 tablet, Rfl: 3;  Rivaroxaban (XARELTO) 20 MG TABS tablet, TAKE 1 TABLET DAILY, Disp: 90 tablet, Rfl: 3;  zolpidem (AMBIEN) 10 MG tablet, take 1 tablet by mouth at bedtime if needed for sleep, Disp: 90 tablet, Rfl: 1  EXAM:  Filed Vitals:   05/23/13 1259  BP: 142/70  Pulse: 68  Temp: 98.3 F (36.8 C)    Body mass index is 23.62 kg/(m^2).  GENERAL: vitals reviewed and listed above, alert, oriented, appears well hydrated and in no acute distress  HEENT: atraumatic, conjunttiva clear, no obvious  abnormalities on inspection of external nose and ears  NECK: no obvious masses on inspection  LUNGS: clear to auscultation bilaterally, no wheezes, rales or rhonchi, good air movement  CV: HRRR, no peripheral edema  MS: moves all extremities without noticeable abnormality  PSYCH: pleasant and cooperative, no obvious depression or anxiety  ASSESSMENT AND PLAN:  Discussed the following assessment and plan:  HYPERTENSION, MALIGNANT ESSENTIAL - Plan: Ambulatory referral to Cardiology  CEREBROVASCULAR DISEASE LATE EFFECTS, NOS - Plan: Ambulatory referral to Cardiology  HYPERLIPIDEMIA - Plan: Ambulatory referral to Cardiology  Acute on chronic diastolic CHF (congestive heart failure), NYHA class 4 - Plan: Ambulatory referral to Cardiology  TIA (transient ischemic attack) - Plan: Ambulatory referral to Cardiology  Atrial fibrillation - Plan: Ambulatory referral to Cardiology  -she reports she is feeling well and appear well today -discussed her chronically uncontrolled, difficult to control BP and they seemed reluctant to do much about this and seemed to feel this is normal for her -discussed risks and fact that it appears she has had trouble with this and hospital visits several times related to this and risks and they opted for referral to cardiologist for management -caregiver is in the process of trying to find PCP closer to home and will notify Dr. Arnoldo Morale when the transfer care -Patient advised to return or notify a doctor immediately if symptoms worsen or persist or new concerns arise.  Patient Instructions  -please let Dr. Arnoldo Morale know when you establish care with a new doctor  -We placed a referral for you as discussed. It usually takes about 1-2 weeks to process and schedule this referral. If you have not heard from [place] regarding this appointment in 2 weeks please contact our office.       Lori Fox

## 2013-05-30 ENCOUNTER — Telehealth: Payer: Self-pay | Admitting: Internal Medicine

## 2013-05-30 NOTE — Telephone Encounter (Signed)
[place] therapist from [facility] has not be able to get in touch with patient to sch OT evaluation

## 2013-05-31 ENCOUNTER — Telehealth: Payer: Self-pay | Admitting: Internal Medicine

## 2013-05-31 MED ORDER — ALPRAZOLAM 0.5 MG PO TABS: 0.5000 mg | ORAL_TABLET | Freq: Two times a day (BID) | ORAL | Status: DC

## 2013-05-31 NOTE — Telephone Encounter (Signed)
Ok per Dr Jenkins, rx faxed to pharmacy 

## 2013-05-31 NOTE — Telephone Encounter (Signed)
[place] requesting refill of ALPRAZolam (XANAX) 0.5 MG tablet

## 2013-06-14 ENCOUNTER — Telehealth: Payer: Self-pay | Admitting: Internal Medicine

## 2013-06-14 NOTE — Telephone Encounter (Signed)
Per chris physical therapist with [facility] is calling because  Pt has  decline to have physical therapy in her home. Please advise

## 2013-06-15 NOTE — Telephone Encounter (Signed)
Noted by Dr Arnoldo Morale

## 2013-06-22 ENCOUNTER — Telehealth: Payer: Self-pay | Admitting: Internal Medicine

## 2013-06-22 NOTE — Telephone Encounter (Signed)
[facility] is requesting re-fill on zolpidem (AMBIEN) 10 MG tablet

## 2013-06-23 MED ORDER — ZOLPIDEM TARTRATE 10 MG PO TABS: ORAL_TABLET | ORAL | Status: DC

## 2013-06-23 NOTE — Telephone Encounter (Signed)
[facility]

## 2013-07-12 ENCOUNTER — Institutional Professional Consult (permissible substitution): Payer: Medicare Other | Admitting: Cardiology

## 2013-08-01 ENCOUNTER — Telehealth: Payer: Self-pay | Admitting: Internal Medicine

## 2013-08-01 NOTE — Telephone Encounter (Signed)
Noted  

## 2013-08-01 NOTE — Telephone Encounter (Signed)
FYI.Marland KitchenMarland KitchenReceived fax from [facility] to serve as notification that pt has been discharged from [place] due to goals being met.

## 2013-08-23 ENCOUNTER — Institutional Professional Consult (permissible substitution): Payer: Medicare Other | Admitting: Cardiology

## 2013-09-30 ENCOUNTER — Other Ambulatory Visit: Payer: Self-pay | Admitting: Internal Medicine

## 2013-11-03 ENCOUNTER — Ambulatory Visit: Payer: Medicare Other | Admitting: Family Medicine

## 2013-11-03 ENCOUNTER — Ambulatory Visit: Payer: Medicare Other | Admitting: Internal Medicine

## 2013-11-06 ENCOUNTER — Ambulatory Visit (INDEPENDENT_AMBULATORY_CARE_PROVIDER_SITE_OTHER): Payer: Medicare Other | Admitting: Family Medicine

## 2013-11-06 ENCOUNTER — Encounter: Payer: Self-pay | Admitting: Family Medicine

## 2013-11-06 ENCOUNTER — Ambulatory Visit: Payer: Medicare Other | Admitting: Family Medicine

## 2013-11-06 VITALS — BP 150/62 | HR 68 | Wt 167.0 lb

## 2013-11-06 DIAGNOSIS — R7309 Other abnormal glucose: Secondary | ICD-10-CM

## 2013-11-06 DIAGNOSIS — I10 Essential (primary) hypertension: Secondary | ICD-10-CM

## 2013-11-06 DIAGNOSIS — E785 Hyperlipidemia, unspecified: Secondary | ICD-10-CM

## 2013-11-06 DIAGNOSIS — I4891 Unspecified atrial fibrillation: Secondary | ICD-10-CM

## 2013-11-06 DIAGNOSIS — I482 Chronic atrial fibrillation, unspecified: Secondary | ICD-10-CM

## 2013-11-06 DIAGNOSIS — F411 Generalized anxiety disorder: Secondary | ICD-10-CM

## 2013-11-06 DIAGNOSIS — N183 Chronic kidney disease, stage 3 unspecified: Secondary | ICD-10-CM

## 2013-11-06 DIAGNOSIS — D649 Anemia, unspecified: Secondary | ICD-10-CM

## 2013-11-06 LAB — LIPID PANEL
CHOLESTEROL: 158 mg/dL (ref 0–200)
HDL: 27.1 mg/dL — ABNORMAL LOW (ref >39.00)
LDL CALC: 105 mg/dL — AB (ref 0–99)
NonHDL: 130.9
Total CHOL/HDL Ratio: 6
Triglycerides: 128 mg/dL (ref 0.0–149.0)
VLDL: 25.6 mg/dL (ref 0.0–40.0)

## 2013-11-06 LAB — COMPREHENSIVE METABOLIC PANEL
ALBUMIN: 3.6 g/dL (ref 3.5–5.2)
ALK PHOS: 153 U/L — AB (ref 39–117)
ALT: 16 U/L (ref 0–35)
AST: 20 U/L (ref 0–37)
BILIRUBIN TOTAL: 0.7 mg/dL (ref 0.2–1.2)
BUN: 27 mg/dL — AB (ref 6–23)
CO2: 26 mEq/L (ref 19–32)
Calcium: 10.1 mg/dL (ref 8.4–10.5)
Chloride: 103 mEq/L (ref 96–112)
Creatinine, Ser: 1.9 mg/dL — ABNORMAL HIGH (ref 0.4–1.2)
GFR: 27.64 mL/min — ABNORMAL LOW (ref >60.00)
Glucose, Bld: 185 mg/dL — ABNORMAL HIGH (ref 70–99)
POTASSIUM: 4.6 meq/L (ref 3.5–5.1)
Sodium: 138 mEq/L (ref 135–145)
Total Protein: 7.2 g/dL (ref 6.0–8.3)

## 2013-11-06 LAB — CBC
HEMATOCRIT: 31.2 % — AB (ref 36.0–46.0)
Hemoglobin: 10.4 g/dL — ABNORMAL LOW (ref 12.0–15.0)
MCHC: 33.3 g/dL (ref 30.0–36.0)
MCV: 93.1 fl (ref 78.0–100.0)
Platelets: 191 10*3/uL (ref 150.0–400.0)
RBC: 3.35 Mil/uL — ABNORMAL LOW (ref 3.87–5.11)
RDW: 15 % (ref 11.5–15.5)
WBC: 5.2 10*3/uL (ref 4.0–10.5)

## 2013-11-06 LAB — TSH: TSH: 2.11 u[IU]/mL (ref 0.35–4.50)

## 2013-11-06 LAB — HEMOGLOBIN A1C: HEMOGLOBIN A1C: 6.1 % (ref 4.6–6.5)

## 2013-11-06 NOTE — Progress Notes (Signed)
Garret Reddish, MD Phone: [phone]  Subjective:   Lori Fox is a 73 y.o. 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 Patient is happy with current #s. She refused to see cardiology as suggested by Dr. Maudie Mercury and refuses again today.  CKD Stage III-stable previously Atrial fibrillation-stable, currently asymptomatic CHF Stage IV-stable, back to weight from February but denies worsening symptoms, already gets some short of breat at rest.   ROS- no chest pain. No fever/chills. No nausea or abdominal pain.   nonfasting  E scripts through [place]  Past Medical History- Patient Active Problem List   Diagnosis Date Noted  . Atrial fibrillation 03/28/2012    Priority: High  . Acute on chronic diastolic CHF (congestive heart failure), NYHA class 4 02/29/2012    Priority: High  . TIA (transient ischemic attack) 02/02/2012    Priority: High  . CVA (cerebral infarction) 02/02/2012    Priority: High  . ANXIETY DISORDER, ACUTE 10/12/2006    Priority: High  . PERIPHERAL VASCULAR DISEASE 09/27/2006    Priority: High  . Anemia 02/29/2012    Priority: Medium  . CKD (chronic kidney disease) stage 3, GFR 30-59 ml/min 02/29/2012    Priority: Medium  . HYPERTENSION, MALIGNANT ESSENTIAL 10/05/2006    Priority: Medium  . HYPERLIPIDEMIA 06/09/2006    Priority: Medium  . Acute respiratory failure 02/29/2012    Priority: Low  . Hidradenitis 12/27/2009    Priority: Low  . CERVICAL STRAIN, WITH RADICULOPATHY 03/23/2008    Priority: Low  . GERD 03/09/2007    Priority: Low  . ALLERGIC RHINITIS 12/10/2006    Priority: Low  . OSTEOARTHRITIS 09/27/2006    Priority: Low   Medications- reviewed and updated Current Outpatient Prescriptions  Medication Sig Dispense Refill  . ALPRAZolam (XANAX) 0.5 MG tablet Take 1 tablet (0.5 mg total) by  mouth 2 (two) times daily.  180 tablet  1  . carvedilol (COREG) 25 MG tablet Take 0.5 tablets (12.5 mg total) by mouth 2 (two) times daily with a meal.  90 tablet  3  . cloNIDine (CATAPRES) 0.2 MG tablet TAKE 1 TABLET THREE TIMES A DAY  270 tablet  0  . diltiazem (CARDIZEM CD) 180 MG 24 hr capsule TAKE 1 CAPSULE DAILY  90 capsule  2  . escitalopram (LEXAPRO) 10 MG tablet TAKE 1 TABLET DAILY  90 tablet  3  . furosemide (LASIX) 20 MG tablet TAKE 1 TABLET DAILY  90 tablet  3  . hydrALAZINE (APRESOLINE) 25 MG tablet TAKE 1 TABLET TWICE A DAY  180 tablet  3  . isosorbide-hydrALAZINE (BIDIL) 20-37.5 MG per tablet Take 1 tablet by mouth 3 (three) times daily.      Marland Kitchen omeprazole (PRILOSEC) 20 MG capsule TAKE 1 CAPSULE DAILY  90 capsule  3  . oxybutynin (DITROPAN XL) 10 MG 24 hr tablet Take 1 tablet (10 mg total) by mouth daily.  90 tablet  3  . Rivaroxaban (XARELTO) 20 MG TABS tablet TAKE 1 TABLET DAILY  90 tablet  3  . zolpidem (AMBIEN) 10 MG tablet take 1 tablet by mouth at bedtime if needed for sleep  90 tablet  1  . cyclobenzaprine (FLEXERIL) 10 MG tablet TAKE 1 TABLET AT BEDTIME AS NEEDED FOR MUSCLE SPASMS  90 tablet  2   No current facility-administered medications for this visit.    Objective: BP 150/62  Pulse 68  Wt 167 lb (75.751 kg) Gen: NAD, resting comfortably CV: irregularly irregular, no rubs, gallops, no JVD Lungs: slight crackles at bilateral bases, no wheeze, rhonchi Ext: no edema Skin: warm, dry, no rash Neuro: grossly normal, moves all extremities  Assessment/Plan:  Patient transitioning care to [facility] provider but cannot get in for several months so wanted to check in and update blood work. Patient refuses any immunizations as says she got pneumonia from pneumonia vaccine.   ANXIETY DISORDER, ACUTE Agreed to refill xanax through transition period to new primary care provider through E scripts through [place].   Hypertension-slight poor control with history CVA and goal  <150. Continue Carvedilol 12.5mg  BID, isosorbide-hydralazine 20-37.5mg  TID Plus hydralazine 25mg  BID (max hydralazine 300mg  daily), lasix 20mg  daily, diltiazem 180mg  ER, clonidine 0.2mg  TID. Patient pleased with current #s and continues to refuse cardiology referral for monitoring. Fortunately her A. Fib (continue dilt, carvedilol, xarelto) and CHF Stage IV  (lasix 20mg  daily) are stable. Her CKD Stage III-stable previously and will check creatinine today. Labs below for regular monitoring of disease states listed.   nonfasting Orders Placed This Encounter  Procedures  . CBC    [place]  . Comprehensive metabolic panel    [place]  . Hemoglobin A1c    [place]  . Lipid panel    [place]  . TSH    [place]

## 2013-11-06 NOTE — Assessment & Plan Note (Signed)
Agreed to refill xanax through transition period to new primary care provider through E scripts through [place].

## 2013-11-06 NOTE — Patient Instructions (Signed)
Glad to meet you.   I agree with you it is a good idea to seek care closer to home as your drive is close to 40 minutes. This would allow you to get in quickly if you weren't feeling well.   We are happy to see you through the transition. See someone at least by February.   Check labs today to make sure good and stable.   Glad you have been doing well!

## 2013-11-21 ENCOUNTER — Telehealth: Payer: Self-pay | Admitting: Internal Medicine

## 2013-11-21 NOTE — Telephone Encounter (Signed)
[facility] is requesting re-fills on zolpidem (AMBIEN) 10 MG tablet  and ALPRAZolam (XANAX) 0.5 MG tablet

## 2013-11-22 MED ORDER — ZOLPIDEM TARTRATE 10 MG PO TABS: ORAL_TABLET | ORAL | Status: DC

## 2013-11-22 MED ORDER — ALPRAZOLAM 0.5 MG PO TABS: 0.5000 mg | ORAL_TABLET | Freq: Two times a day (BID) | ORAL | Status: AC

## 2013-11-22 NOTE — Telephone Encounter (Signed)
Are these ok to refill? 

## 2013-11-22 NOTE — Telephone Encounter (Signed)
Refill faxed to [facility]

## 2013-11-22 NOTE — Telephone Encounter (Signed)
Yes, but make sure patient has scheduled with a new provider in [facility] as planned.   Also, please advise would prefer her to take 1/2 tablet of ambien.

## 2013-12-12 ENCOUNTER — Telehealth: Payer: Self-pay | Admitting: Internal Medicine

## 2013-12-12 NOTE — Telephone Encounter (Signed)
[place] is requesting re-fill on diltiazem (CARDIZEM CD) 180 MG 24 hr capsule

## 2013-12-13 ENCOUNTER — Telehealth: Payer: Self-pay | Admitting: Internal Medicine

## 2013-12-13 MED ORDER — DILTIAZEM HCL ER COATED BEADS 180 MG PO CP24: ORAL_CAPSULE | ORAL | Status: AC

## 2013-12-13 MED ORDER — CLONIDINE HCL 0.2 MG PO TABS: ORAL_TABLET | ORAL | Status: AC

## 2013-12-13 NOTE — Telephone Encounter (Signed)
[facility] is requesting re-fill on cloNIDine (CATAPRES) 0.2 MG tablet

## 2013-12-13 NOTE — Telephone Encounter (Signed)
rx sent in electronically 

## 2013-12-13 NOTE — Telephone Encounter (Signed)
rx sent electronically. 

## 2014-01-16 ENCOUNTER — Telehealth: Payer: Self-pay | Admitting: Internal Medicine

## 2014-01-16 NOTE — Telephone Encounter (Signed)
Yes you can work pt in

## 2014-01-16 NOTE — Telephone Encounter (Signed)
Pt will dc from hosp on 12/10. Pt needs post hosp fup in 7-10 days. (admitted for a-fib)  Is it ok to work in?

## 2014-01-17 NOTE — Telephone Encounter (Signed)
Pt has been scheduled.  °

## 2014-01-25 ENCOUNTER — Ambulatory Visit: Payer: Medicare Other | Admitting: Family Medicine

## 2014-02-07 ENCOUNTER — Telehealth: Payer: Self-pay | Admitting: Internal Medicine

## 2014-02-07 MED ORDER — ZOLPIDEM TARTRATE 10 MG PO TABS: ORAL_TABLET | ORAL | Status: AC

## 2014-02-07 NOTE — Telephone Encounter (Signed)
Ok per Dr Arnoldo Morale, rx faxed to [facility]

## 2014-02-07 NOTE — Telephone Encounter (Signed)
[place], [place] [phone] is requesting re-fill on zolpidem (AMBIEN) 10 MG tablet She is scheduled to see Dr. Yong Channel in January and March.

## 2014-02-15 ENCOUNTER — Ambulatory Visit: Payer: Medicare Other | Admitting: Family Medicine

## 2014-03-02 ENCOUNTER — Ambulatory Visit: Payer: Medicare Other | Admitting: Family Medicine

## 2014-03-27 ENCOUNTER — Telehealth: Payer: Self-pay

## 2014-03-27 MED ORDER — RIVAROXABAN 20 MG PO TABS: ORAL_TABLET | ORAL | Status: AC

## 2014-03-27 MED ORDER — FUROSEMIDE 20 MG PO TABS: ORAL_TABLET | ORAL | Status: AC

## 2014-03-27 MED ORDER — ESCITALOPRAM OXALATE 10 MG PO TABS: ORAL_TABLET | ORAL | Status: AC

## 2014-03-27 MED ORDER — HYDRALAZINE HCL 25 MG PO TABS: ORAL_TABLET | ORAL | Status: AC

## 2014-03-27 MED ORDER — OMEPRAZOLE 20 MG PO CPDR: DELAYED_RELEASE_CAPSULE | ORAL | Status: AC

## 2014-03-27 NOTE — Telephone Encounter (Signed)
[facility] refill requests for FUROSEMIDE 20MG  TABS, OMEPRAZOLE 20MG  TABS, XARELTO 20MG  TABS, ESCITALOPRAM 10MG  TABS all for #90 with 3 refils and HYDRALAZINE 25MG  TABS #180 with 3 refills,

## 2014-03-27 NOTE — Telephone Encounter (Signed)
Medications refilled

## 2014-04-10 DEATH — deceased

## 2014-04-26 ENCOUNTER — Telehealth: Payer: Self-pay

## 2014-04-26 ENCOUNTER — Ambulatory Visit: Payer: Medicare Other | Admitting: Family Medicine

## 2014-04-26 NOTE — Telephone Encounter (Signed)
Called to see if pt was able to find another doctor or if she was coming to her appt today and i was informed by care giver that pt is deceased as of 04/25/2014.
